# Patient Record
Sex: Male | Born: 1960 | Race: White | Hispanic: No | Marital: Married | Smoking: Former smoker
Health system: Southern US, Community
[De-identification: ages and names within clinical notes are randomized; demographics above are authoritative.]

## PROBLEM LIST (undated history)

## (undated) DIAGNOSIS — G4730 Sleep apnea, unspecified: Secondary | ICD-10-CM

## (undated) DIAGNOSIS — K649 Unspecified hemorrhoids: Secondary | ICD-10-CM

## (undated) DIAGNOSIS — T7840XA Allergy, unspecified, initial encounter: Secondary | ICD-10-CM

## (undated) DIAGNOSIS — I10 Essential (primary) hypertension: Secondary | ICD-10-CM

## (undated) DIAGNOSIS — M1990 Unspecified osteoarthritis, unspecified site: Secondary | ICD-10-CM

## (undated) HISTORY — PX: UVULOPALATOPHARYNGOPLASTY: SHX827

## (undated) HISTORY — PX: TONSILLECTOMY AND ADENOIDECTOMY: SUR1326

## (undated) HISTORY — DX: Unspecified osteoarthritis, unspecified site: M19.90

## (undated) HISTORY — DX: Essential (primary) hypertension: I10

## (undated) HISTORY — PX: OTHER SURGICAL HISTORY: SHX169

## (undated) HISTORY — DX: Unspecified hemorrhoids: K64.9

## (undated) HISTORY — DX: Sleep apnea, unspecified: G47.30

## (undated) HISTORY — DX: Allergy, unspecified, initial encounter: T78.40XA

---

## 1984-12-08 HISTORY — PX: OTHER SURGICAL HISTORY: SHX169

## 1999-01-27 ENCOUNTER — Encounter: Payer: Self-pay | Admitting: Emergency Medicine

## 1999-01-27 ENCOUNTER — Emergency Department (HOSPITAL_COMMUNITY): Admission: EM | Admit: 1999-01-27 | Discharge: 1999-01-27 | Payer: Self-pay | Admitting: Emergency Medicine

## 2002-03-14 ENCOUNTER — Encounter: Payer: Self-pay | Admitting: Family Medicine

## 2002-03-14 ENCOUNTER — Ambulatory Visit (HOSPITAL_COMMUNITY): Admission: RE | Admit: 2002-03-14 | Discharge: 2002-03-14 | Payer: Self-pay | Admitting: Family Medicine

## 2002-03-16 ENCOUNTER — Ambulatory Visit: Admission: RE | Admit: 2002-03-16 | Discharge: 2002-03-16 | Payer: Self-pay | Admitting: Family Medicine

## 2002-08-19 ENCOUNTER — Encounter: Payer: Self-pay | Admitting: Occupational Medicine

## 2002-08-19 ENCOUNTER — Encounter: Admission: RE | Admit: 2002-08-19 | Discharge: 2002-08-19 | Payer: Self-pay | Admitting: Occupational Medicine

## 2002-09-12 ENCOUNTER — Ambulatory Visit: Admission: RE | Admit: 2002-09-12 | Discharge: 2002-09-12 | Payer: Self-pay | Admitting: Critical Care Medicine

## 2002-12-10 ENCOUNTER — Emergency Department (HOSPITAL_COMMUNITY): Admission: EM | Admit: 2002-12-10 | Discharge: 2002-12-10 | Payer: Self-pay | Admitting: Emergency Medicine

## 2003-10-02 ENCOUNTER — Encounter: Payer: Self-pay | Admitting: Family Medicine

## 2003-10-02 ENCOUNTER — Encounter: Admission: RE | Admit: 2003-10-02 | Discharge: 2003-10-02 | Payer: Self-pay | Admitting: Family Medicine

## 2003-11-28 ENCOUNTER — Encounter: Admission: RE | Admit: 2003-11-28 | Discharge: 2004-01-24 | Payer: Self-pay | Admitting: Neurology

## 2003-12-11 ENCOUNTER — Ambulatory Visit (HOSPITAL_COMMUNITY): Admission: RE | Admit: 2003-12-11 | Discharge: 2003-12-11 | Payer: Self-pay | Admitting: Neurology

## 2004-12-19 ENCOUNTER — Emergency Department (HOSPITAL_COMMUNITY): Admission: EM | Admit: 2004-12-19 | Discharge: 2004-12-19 | Payer: Self-pay | Admitting: *Deleted

## 2004-12-20 ENCOUNTER — Ambulatory Visit: Payer: Self-pay | Admitting: Family Medicine

## 2005-01-03 ENCOUNTER — Ambulatory Visit: Payer: Self-pay | Admitting: Family Medicine

## 2005-06-11 ENCOUNTER — Emergency Department (HOSPITAL_COMMUNITY): Admission: EM | Admit: 2005-06-11 | Discharge: 2005-06-11 | Payer: Self-pay | Admitting: Emergency Medicine

## 2006-06-23 ENCOUNTER — Ambulatory Visit (HOSPITAL_COMMUNITY): Admission: RE | Admit: 2006-06-23 | Discharge: 2006-06-24 | Payer: Self-pay | Admitting: Neurosurgery

## 2006-10-27 ENCOUNTER — Ambulatory Visit: Payer: Self-pay | Admitting: Family Medicine

## 2007-03-12 ENCOUNTER — Ambulatory Visit: Payer: Self-pay | Admitting: Internal Medicine

## 2007-12-01 ENCOUNTER — Ambulatory Visit: Payer: Self-pay | Admitting: Internal Medicine

## 2009-03-05 ENCOUNTER — Ambulatory Visit: Payer: Self-pay | Admitting: Family Medicine

## 2009-03-05 DIAGNOSIS — I10 Essential (primary) hypertension: Secondary | ICD-10-CM | POA: Insufficient documentation

## 2009-03-14 ENCOUNTER — Ambulatory Visit: Payer: Self-pay | Admitting: Family Medicine

## 2010-02-28 ENCOUNTER — Ambulatory Visit: Payer: Self-pay | Admitting: Family Medicine

## 2010-02-28 LAB — CONVERTED CEMR LAB
Bilirubin Urine: NEGATIVE
Glucose, Urine, Semiquant: NEGATIVE
Specific Gravity, Urine: 1.015
WBC Urine, dipstick: NEGATIVE
pH: 6

## 2010-03-01 LAB — CONVERTED CEMR LAB
ALT: 19 units/L (ref 0–53)
BUN: 12 mg/dL (ref 6–23)
CO2: 32 meq/L (ref 19–32)
Chloride: 102 meq/L (ref 96–112)
Cholesterol: 147 mg/dL (ref 0–200)
Direct LDL: 89.7 mg/dL
Eosinophils Absolute: 0.2 10*3/uL (ref 0.0–0.7)
Eosinophils Relative: 2.5 % (ref 0.0–5.0)
Glucose, Bld: 102 mg/dL — ABNORMAL HIGH (ref 70–99)
HCT: 41.6 % (ref 39.0–52.0)
Lymphs Abs: 1.6 10*3/uL (ref 0.7–4.0)
MCHC: 32.9 g/dL (ref 30.0–36.0)
MCV: 85.7 fL (ref 78.0–100.0)
Monocytes Absolute: 0.5 10*3/uL (ref 0.1–1.0)
Platelets: 207 10*3/uL (ref 150.0–400.0)
Potassium: 3.9 meq/L (ref 3.5–5.1)
TSH: 1.09 microintl units/mL (ref 0.35–5.50)
Total Bilirubin: 0.4 mg/dL (ref 0.3–1.2)
Total Protein: 7.4 g/dL (ref 6.0–8.3)
VLDL: 41.6 mg/dL — ABNORMAL HIGH (ref 0.0–40.0)
WBC: 7.6 10*3/uL (ref 4.5–10.5)

## 2010-03-18 ENCOUNTER — Ambulatory Visit: Payer: Self-pay | Admitting: Family Medicine

## 2010-03-18 DIAGNOSIS — J309 Allergic rhinitis, unspecified: Secondary | ICD-10-CM | POA: Insufficient documentation

## 2010-03-18 DIAGNOSIS — R609 Edema, unspecified: Secondary | ICD-10-CM | POA: Insufficient documentation

## 2011-01-05 LAB — CONVERTED CEMR LAB
ALT: 26 units/L (ref 0–53)
Albumin: 4.2 g/dL (ref 3.5–5.2)
BUN: 12 mg/dL (ref 6–23)
Basophils Relative: 0.3 % (ref 0.0–3.0)
CO2: 31 meq/L (ref 19–32)
Chloride: 106 meq/L (ref 96–112)
Cholesterol: 153 mg/dL (ref 0–200)
Creatinine, Ser: 0.9 mg/dL (ref 0.4–1.5)
Eosinophils Absolute: 0.2 10*3/uL (ref 0.0–0.7)
Eosinophils Relative: 2.5 % (ref 0.0–5.0)
HCT: 43.5 % (ref 39.0–52.0)
LDL Cholesterol: 88 mg/dL (ref 0–99)
Lymphs Abs: 1.9 10*3/uL (ref 0.7–4.0)
MCHC: 35.4 g/dL (ref 30.0–36.0)
MCV: 82.9 fL (ref 78.0–100.0)
Monocytes Absolute: 0.4 10*3/uL (ref 0.1–1.0)
Neutrophils Relative %: 66.6 % (ref 43.0–77.0)
Platelets: 200 10*3/uL (ref 150.0–400.0)
Potassium: 4.1 meq/L (ref 3.5–5.1)
RBC: 5.25 M/uL (ref 4.22–5.81)
TSH: 1.65 microintl units/mL (ref 0.35–5.50)
Total Protein: 7.4 g/dL (ref 6.0–8.3)
Triglycerides: 181 mg/dL — ABNORMAL HIGH (ref 0.0–149.0)
WBC: 7.6 10*3/uL (ref 4.5–10.5)

## 2011-01-07 NOTE — Assessment & Plan Note (Signed)
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  It is always been in excellent health.  He said no chronic health problems except for hypertension.  He takes Zestoretic 20 -- 12.5 daily.  BP 120/74.  The new problem is allergic rhinitis for the past month.  No history of asthma.  He does take an aspirin tablet daily.  Another problem is ED.  Wants to try the Viagra.  History Jeni care, dental care, tetanus booster 2003, seasonal flu shot 2010  Allergies: No Known Drug Allergies  Past History:  Past medical, surgical, family and social histories (including risk factors) reviewed, and no changes noted (except as noted below).  Family History: Reviewed history and no changes required.  Social History: Reviewed history from 03/05/2009 and no changes required. Occupation:  organic farmer Married Never Smoked Alcohol use-no Drug use-no Regular exercise-yes  Review of Systems      See HPI  Physical Exam  General:  Well-developed,well-nourished,in no acute distress; alert,appropriate and cooperative throughout examination Head:  Normocephalic and atraumatic without obvious abnormalities. No apparent alopecia or balding. Eyes:  No corneal or conjunctival inflammation noted. EOMI. Perrla. Funduscopic exam benign, without hemorrhages, exudates or papilledema. Vision grossly normal. Ears:  External ear exam shows no significant lesions or deformities.  Otoscopic examination reveals clear canals, tympanic membranes are intact bilaterally without bulging, retraction, inflammation or discharge. Hearing is grossly normal  bilaterally. Nose:  External nasal examination shows no deformity or inflammation. Nasal mucosa are pink and moist without lesions or exudates. Mouth:  Oral mucosa and oropharynx without lesions or exudates.  Teeth in good repair. Neck:  No deformities, masses, or tenderness noted. Chest Wall:  No deformities, masses, tenderness or gynecomastia noted. Breasts:  No masses or gynecomastia noted Lungs:  Normal respiratory effort, chest expands symmetrically. Lungs are clear to auscultation, no crackles or wheezes. Heart:  Normal rate and regular rhythm. S1 and S2 normal without gallop, murmur, click, rub or other extra sounds. Abdomen:  Bowel sounds positive,abdomen soft and non-tender without masses, organomegaly or hernias noted. Rectal:  No external abnormalities noted. Normal sphincter tone. No rectal masses or tenderness. Genitalia:  Testes bilaterally descended without nodularity, tenderness or masses. No scrotal masses or lesions. No penis lesions or urethral discharge. Prostate:  Prostate gland firm and smooth, no enlargement, nodularity, tenderness, mass, asymmetry or induration. Msk:  No deformity or scoliosis noted of thoracic or lumbar spine.   Pulses:  R and L carotid,radial,femoral,dorsalis pedis and posterior tibial pulses are full and equal bilaterally Extremities:  1+ left pedal edema and 1+ right pedal edema.   Neurologic:  No cranial nerve deficits noted. Station and gait are normal. Plantar reflexes are down-going bilaterally. DTRs are symmetrical throughout. Sensory, motor and coordinative functions appear intact. Skin:  Intact without suspicious lesions or rashes Cervical Nodes:  No lymphadenopathy noted Axillary Nodes:  No palpable lymphadenopathy Inguinal Nodes:  No significant adenopathy Psych:  Cognition and judgment appear intact. Alert and cooperative with normal attention span and concentration.  No apparent delusions, illusions, hallucinations   Problems:  Medical  Problems Added: 1)  Dx of Physical Examination  (ICD-V70.0) 2)  Dx of Rhinitis  (ICD-477.9) 3)  Dx of Edema- Localized  (ICD-782.3)  Impression & Recommendations:  Problem # 1:  RHINITIS (ICD-477.9) Assessment New  Orders: Prescription Created Electronically [phone])  Problem # 2:  EDEMA- LOCALIZED (ICD-782.3) Assessment: New  The following medications were removed from the medication list:    Zestoretic 20-12.5 Mg Tabs (Lisinopril-hydrochlorothiazide) .Marland Kitchen... Take 1 tablet by mouth every morning His updated medication list for this problem includes:    Furosemide 20 Mg Tabs (Furosemide) .Marland Kitchen... Take 1 tablet by mouth every morning    Zestoretic 20-12.5 Mg Tabs (Lisinopril-hydrochlorothiazide) .Marland Kitchen... Take 1 tablet by mouth every morning  Orders: Prescription Created Electronically [id number])  Problem # 3:  HYPERTENSION NEC (ICD-997.91) Assessment: Improved  Orders: Prescription Created Electronically [phone]) EKG w/ Interpretation ([id number])  Problem # 4:  Preventive Health Care (ICD-V70.0) Assessment: Unchanged  Complete Medication List: 1)  Hydrocodone-acetaminophen 10-325 Mg Tabs (Hydrocodone-acetaminophen) .... Take 2 tabs three times a day 2)  Zantac 75 75 Mg Tabs (Ranitidine hcl) .... Once daily 3)  Aspir-low 81 Mg Tbec (Aspirin) .... Once daily 4)  Furosemide 20 Mg Tabs (Furosemide) .... Take 1 tablet by mouth every morning 5)  Zestoretic 20-12.5 Mg Tabs (Lisinopril-hydrochlorothiazide) .... Take 1 tablet by mouth every morning 6)  Viagra 50 Mg Tabs (Sildenafil citrate) .... Uad  Patient Instructions: 1)  take 20 mg of Lasix in the morning as needed for fluid retention. 2)  Continue other medications. 3)  The way to dose  Viagra is a one half tablet one hour prior to sex with water 4)  Please schedule a follow-up appointment in 1 year. 5)  Take an Aspirin every day. 6)  plain Claritin in the morning or plain Zyrtec at bedtime for the allergic rhinitis and  cough Prescriptions: VIAGRA 50 MG TABS (SILDENAFIL CITRATE) UAD  #6 x 11   Entered and Authorized by:   Roderick Pee MD   Signed by:   Roderick Pee MD on 03/18/2010   Method used:   Electronically to        [facility] [phone]* (retail)       [address]       [place], [place]  [address]       Ph: [phone] or [phone]       Fax: [phone]   RxID:   [id number] ZESTORETIC 20-12.5 MG TABS (LISINOPRIL-HYDROCHLOROTHIAZIDE) Take 1 tablet by mouth every morning  #100 x 3   Entered and Authorized by:   Roderick Pee MD   Signed by:   Roderick Pee MD on 03/18/2010   Method used:   Electronically to        [facility] [phone]* (retail)       [address]       [place], [place]  [address]       Ph: [phone] or [phone]       Fax: [phone]   RxID:   [id number] FUROSEMIDE 20 MG TABS (FUROSEMIDE) Take 1 tablet by mouth every morning  #100 x 3   Entered and Authorized by:   Roderick Pee MD   Signed by:   Roderick Pee MD on 03/18/2010   Method used:   Electronically to  [address] [phone]* (retail)       [address]       [place], [place]  [address]       Ph: [phone] or [phone]       Fax: [phone]   RxID:   [id number]    Immunization History:  Tetanus/Td Immunization History:    Tetanus/Td:  historical (12/08/2001)
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
## 2011-04-25 NOTE — Op Note (Signed)
NAME:  KHYRIN, TREVATHAN NO.:  [id number]   MEDICAL RECORD NO.:  [id number]          PATIENT TYPE:  OIB   LOCATION:  3018                         FACILITY:  [facility]   PHYSICIAN:  Payton Doughty, M.D.      DATE OF BIRTH:  12-May-1961   DATE OF PROCEDURE:  06/23/2006  DATE OF DISCHARGE:                                 OPERATIVE REPORT   PREOPERATIVE DIAGNOSIS:  Spondylosis and disk at C5-6, C6-7.   POSTOPERATIVE DIAGNOSIS:  Spondylosis and disk at C5-6, C6-7.   PROCEDURE:  C5-6, C6-7 anterior cervical decompression and fusion with  reflex hybrid plate.   SURGEON:  Payton Doughty, M.D.   ANESTHESIA:  General endotracheal.   PREPARATION:  Prepped with sterile prep with alcohol wipe.   COMPLICATIONS:  None.   ASSISTANT:  Nurse assistant was [place].  Doctor assistant was Dr.  Lovell Sheehan.   PROCEDURE IN DETAIL:  This patient is a 50 year old gentleman with cervical  spondylosis and left C6 radiculopathy.  He was taken to the operating room  where he was intubated.  Placed supine on the operating room table in the  Halter head traction with the neck extended.  Following shaving, he was  prepped and draped in the usual sterile fashion.  The skin was incised in  the midline and the medial border of the sternocleidomastoid muscle on the  left side.  The platysmas was identified, elevated and divided and  undermined.  Sternocleidomastoid was identified and medial dissection  revealed the carotid artery, retracted laterally to the left.  Trachea and  esophagus were retracted laterally to the right exposing the bony cervical  spine.  Markers were placed.  Intraoperative x-ray obtained and confirmed  correctness of level.  Having confirmed correctness of level, blunt scar was  taken down bilaterally and a self-retaining __________ retractor placed  transversely in a cephalocaudal direction.  Diskectomy was carried out at C5-  6 and C6-7 under gross observation.  The operating  microscope was then  brought in.  We used microdissection technique to remove the remaining disk,  explore the neuroforamen, divide the posterior annulus and the posterior  longitudinal ligament.  At 5-6, there was disk out to the left side and the  neuroforamen was removed without difficulty.  At 6-7, there was a central  inferiorly extruded disk in the midline.  Following complete diskectomies  and exploration of all four neuroforamen, 8-mm bone grafts were fashioned  from patella allograft and tapped into place.  A 37-mm reflex hybrid plate  was placed with 12-mm screws, two in C5, two in C6 and two in C7.  Intraoperative x-rays showed good placement of bone grafts, plate and  screws.  The wound was irrigated and hemostasis assured.  The platysmas and  subcutaneous tissues were re-approximated with 3-0 Vicryl in an interrupted  fashion.  Skin was closed with 4-0 Vicryl in a running subcuticular fashion.  Benzoin and Steri-strips were placed and made occlusive with Telfa and  OpSite.  The patient was placed in an aspen collar and returned to the  recovery room in good condition.  ______________________________  Payton Doughty, M.D.     MWR/MEDQ  D:  06/23/2006  T:  06/24/2006  Job:  [id number]

## 2011-05-01 ENCOUNTER — Other Ambulatory Visit: Payer: Self-pay

## 2011-05-02 ENCOUNTER — Other Ambulatory Visit (INDEPENDENT_AMBULATORY_CARE_PROVIDER_SITE_OTHER): Payer: BC Managed Care – PPO

## 2011-05-02 DIAGNOSIS — Z0000 Encounter for general adult medical examination without abnormal findings: Secondary | ICD-10-CM

## 2011-05-02 LAB — LIPID PANEL
HDL: 33 mg/dL — ABNORMAL LOW (ref >39.00)
Total CHOL/HDL Ratio: 5

## 2011-05-02 LAB — POCT URINALYSIS DIPSTICK
Blood, UA: NEGATIVE
Glucose, UA: NEGATIVE
Nitrite, UA: NEGATIVE
Urobilinogen, UA: 0.2

## 2011-05-02 LAB — BASIC METABOLIC PANEL
Calcium: 8.9 mg/dL (ref 8.4–10.5)
Chloride: 101 mEq/L (ref 96–112)
Creatinine, Ser: 1 mg/dL (ref 0.4–1.5)
Sodium: 137 mEq/L (ref 135–145)

## 2011-05-02 LAB — CBC WITH DIFFERENTIAL/PLATELET
Basophils Relative: 0.4 % (ref 0.0–3.0)
Eosinophils Relative: 1.8 % (ref 0.0–5.0)
Lymphocytes Relative: 18.1 % (ref 12.0–46.0)
Monocytes Relative: 7.8 % (ref 3.0–12.0)
Neutrophils Relative %: 71.9 % (ref 43.0–77.0)
RBC: 4.9 Mil/uL (ref 4.22–5.81)
WBC: 10 10*3/uL (ref 4.5–10.5)

## 2011-05-02 LAB — HEPATIC FUNCTION PANEL
ALT: 17 U/L (ref 0–53)
AST: 18 U/L (ref 0–37)
Alkaline Phosphatase: 53 U/L (ref 39–117)
Bilirubin, Direct: 0.1 mg/dL (ref 0.0–0.3)
Total Protein: 7.1 g/dL (ref 6.0–8.3)

## 2011-05-02 LAB — TSH: TSH: 2.06 u[IU]/mL (ref 0.35–5.50)

## 2011-05-08 ENCOUNTER — Ambulatory Visit (INDEPENDENT_AMBULATORY_CARE_PROVIDER_SITE_OTHER): Payer: BC Managed Care – PPO | Admitting: Family Medicine

## 2011-05-08 ENCOUNTER — Encounter: Payer: Self-pay | Admitting: Family Medicine

## 2011-05-08 DIAGNOSIS — N529 Male erectile dysfunction, unspecified: Secondary | ICD-10-CM | POA: Insufficient documentation

## 2011-05-08 DIAGNOSIS — I10 Essential (primary) hypertension: Secondary | ICD-10-CM

## 2011-05-08 DIAGNOSIS — R5381 Other malaise: Secondary | ICD-10-CM

## 2011-05-08 DIAGNOSIS — R5383 Other fatigue: Secondary | ICD-10-CM | POA: Insufficient documentation

## 2011-05-08 MED ORDER — LISINOPRIL-HYDROCHLOROTHIAZIDE 20-25 MG PO TABS: 1.0000 | ORAL_TABLET | Freq: Every day | ORAL | Status: DC

## 2011-05-08 MED ORDER — SILDENAFIL CITRATE 100 MG PO TABS: 100.0000 mg | ORAL_TABLET | ORAL | Status: DC | PRN

## 2011-05-08 MED ORDER — FUROSEMIDE 20 MG PO TABS: 20.0000 mg | ORAL_TABLET | Freq: Every day | ORAL | Status: DC

## 2011-05-08 NOTE — Patient Instructions (Addendum)
Continue your current medications.  I will call you when I get the report on your lab work back.  Increase the Viagra to 100 mg   take the Lasix one Monday, Wednesday, Friday

## 2011-05-08 NOTE — Progress Notes (Signed)
  Subjective:    Patient ID: Kenneth Berry, male    DOB: July 21, 1961, 50 y.o.   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  No new problems of fatigue.  Four hypertension.  He takes Zestoretic 20 to 25 daily.  BP 110/78.  He takes Lasix 20 mg q.a.m. Because of a history of venous insufficiency and peripheral edema.  He takes OTC Zantac 25 mg daily for reflux esophagitis.  He uses Viagra, but now has used 50 mg a day.  Still is not having good function.  He also has fatigue, and no libido.  We will need to check a testosterone level.  He also takes an 81-mg baby aspirin.  He gets routine eye care, dental care, tetanus, 2003.    Review of Systems  Constitutional: Positive for fatigue.  HENT: Negative.   Eyes: Negative.   Respiratory: Negative.   Cardiovascular: Negative.   Gastrointestinal: Negative.   Genitourinary: Negative.   Musculoskeletal: Negative.   Skin: Negative.   Neurological: Negative.   Hematological: Negative.   Psychiatric/Behavioral: Negative.        Objective:   Physical Exam  Constitutional: He is oriented to person, place, and time. He appears well-developed and well-nourished.  HENT:  Head: Normocephalic and atraumatic.  Right Ear: External ear normal.  Left Ear: External ear normal.  Nose: Nose normal.  Mouth/Throat: Oropharynx is clear and moist.  Eyes: Conjunctivae and EOM are normal. Pupils are equal, round, and reactive to light.  Neck: Normal range of motion. Neck supple. No JVD present. No tracheal deviation present. No thyromegaly present.  Cardiovascular: Normal rate, regular rhythm, normal heart sounds and intact distal pulses.  Exam reveals no gallop and no friction rub.   No murmur heard. Pulmonary/Chest: Effort normal and breath  sounds normal. No stridor. No respiratory distress. He has no wheezes. He has no rales. He exhibits no tenderness.  Abdominal: Soft. Bowel sounds are normal. He exhibits no distension and no mass. There is no tenderness. There is no rebound and no guarding.  Genitourinary: Rectum normal, prostate normal and penis normal. Guaiac negative stool. No penile tenderness.  Musculoskeletal: Normal range of motion. He exhibits no edema and no tenderness.  Lymphadenopathy:    He has no cervical adenopathy.  Neurological: He is alert and oriented to person, place, and time. He has normal reflexes. No cranial nerve deficit. He exhibits normal muscle tone.  Skin: Skin is warm and dry. No rash noted. No erythema. No pallor.  Psychiatric: He has a normal mood and affect. His behavior is normal. Judgment and thought content normal.          Assessment & Plan:  Hypertension continue the Zestoretic one daily.  Peripheral edema continue Lasix 20 mg daily  Erectile dysfunction, increase the Viagra to 100 mg p.r.n.  Fatigue.  Check testosterone level

## 2011-05-09 LAB — TESTOSTERONE: Testosterone: 144.88 ng/dL — ABNORMAL LOW (ref 350.00–890.00)

## 2011-05-15 ENCOUNTER — Telehealth: Payer: Self-pay | Admitting: *Deleted

## 2011-05-15 MED ORDER — TESTOSTERONE 12.5 MG/ACT (1%) TD GEL: 2.0000 | TRANSDERMAL | Status: DC

## 2011-05-15 NOTE — Telephone Encounter (Signed)
Message copied by Trenton Gammon on Thu May 15, 2011  3:14 PM ------      Message from: TODD, JEFFREY A      Created: Sat May 10, 2011 10:39 AM       Testosterone level is 144...............Marland Kitchen Low normal was 350........... Therefore he does have a testosterone deficiency........ Please call N. Androgel.......Marland Kitchen Directions two puffs daily,,,,,,,,, follow-up TSH level in 4 weeks with office visit

## 2011-05-15 NOTE — Telephone Encounter (Signed)
Pt is aware, rx called in, appointment made.

## 2011-05-16 ENCOUNTER — Other Ambulatory Visit: Payer: Self-pay | Admitting: Family Medicine

## 2011-05-16 NOTE — Telephone Encounter (Signed)
Patient would like to know if there is anything other than androgel to try because it will cost $180?

## 2011-06-10 ENCOUNTER — Other Ambulatory Visit (INDEPENDENT_AMBULATORY_CARE_PROVIDER_SITE_OTHER): Payer: BC Managed Care – PPO

## 2011-06-10 DIAGNOSIS — N529 Male erectile dysfunction, unspecified: Secondary | ICD-10-CM

## 2011-06-12 NOTE — Progress Notes (Signed)
Left message on machine for patient

## 2012-05-18 ENCOUNTER — Other Ambulatory Visit: Payer: Self-pay | Admitting: Family Medicine

## 2012-07-22 ENCOUNTER — Other Ambulatory Visit: Payer: Self-pay | Admitting: Family Medicine

## 2012-09-18 ENCOUNTER — Other Ambulatory Visit: Payer: Self-pay | Admitting: Family Medicine

## 2013-04-07 ENCOUNTER — Ambulatory Visit (INDEPENDENT_AMBULATORY_CARE_PROVIDER_SITE_OTHER): Payer: BC Managed Care – PPO | Admitting: Family Medicine

## 2013-04-07 ENCOUNTER — Encounter: Payer: Self-pay | Admitting: Family Medicine

## 2013-04-07 VITALS — BP 140/90 | Temp 98.8°F | Wt 286.0 lb

## 2013-04-07 DIAGNOSIS — R609 Edema, unspecified: Secondary | ICD-10-CM

## 2013-04-07 DIAGNOSIS — I10 Essential (primary) hypertension: Secondary | ICD-10-CM

## 2013-04-07 DIAGNOSIS — IMO0002 Reserved for concepts with insufficient information to code with codable children: Secondary | ICD-10-CM

## 2013-04-07 DIAGNOSIS — N529 Male erectile dysfunction, unspecified: Secondary | ICD-10-CM

## 2013-04-07 LAB — BASIC METABOLIC PANEL
CO2: 27 mEq/L (ref 19–32)
Chloride: 105 mEq/L (ref 96–112)
Potassium: 4.2 mEq/L (ref 3.5–5.1)

## 2013-04-07 LAB — CBC WITH DIFFERENTIAL/PLATELET
Basophils Relative: 0.4 % (ref 0.0–3.0)
Eosinophils Relative: 2.1 % (ref 0.0–5.0)
HCT: 44 % (ref 39.0–52.0)
Hemoglobin: 14.9 g/dL (ref 13.0–17.0)
Lymphs Abs: 1.9 10*3/uL (ref 0.7–4.0)
MCV: 81.4 fl (ref 78.0–100.0)
Monocytes Absolute: 0.6 10*3/uL (ref 0.1–1.0)
Neutro Abs: 7.2 10*3/uL (ref 1.4–7.7)
Platelets: 216 10*3/uL (ref 150.0–400.0)
RBC: 5.4 Mil/uL (ref 4.22–5.81)
WBC: 10 10*3/uL (ref 4.5–10.5)

## 2013-04-07 LAB — POCT URINALYSIS DIPSTICK
Blood, UA: NEGATIVE
Ketones, UA: NEGATIVE
Protein, UA: NEGATIVE
Spec Grav, UA: 1.02
pH, UA: 6

## 2013-04-07 LAB — HEPATIC FUNCTION PANEL
ALT: 18 U/L (ref 0–53)
Bilirubin, Direct: 0.1 mg/dL (ref 0.0–0.3)
Total Bilirubin: 0.6 mg/dL (ref 0.3–1.2)

## 2013-04-07 LAB — LDL CHOLESTEROL, DIRECT: Direct LDL: 105.5 mg/dL

## 2013-04-07 LAB — LIPID PANEL: HDL: 26.9 mg/dL — ABNORMAL LOW (ref >39.00)

## 2013-04-07 MED ORDER — FUROSEMIDE 20 MG PO TABS: ORAL_TABLET | ORAL | Status: DC

## 2013-04-07 MED ORDER — SILDENAFIL CITRATE 100 MG PO TABS: 100.0000 mg | ORAL_TABLET | ORAL | Status: DC | PRN

## 2013-04-07 MED ORDER — LISINOPRIL-HYDROCHLOROTHIAZIDE 20-25 MG PO TABS: 1.0000 | ORAL_TABLET | Freq: Every day | ORAL | Status: DC

## 2013-04-07 NOTE — Progress Notes (Signed)
  Subjective:    Patient ID: GIULIO BERTINO, male    DOB: [birth year], 52 y.o.   MRN: [id number]  HPI Tilman is a 52 year old married male nonsmoker who comes in today after a years absence to discuss hypertension reflux esophagitis erectile dysfunction overweight and low testosterone  He is not taking any of his medications for the last couple months. Weight is 286 BP 140/90.  He also tells me he had cervical disc surgery by Dr. Joylene Igo. He saw a neurologist in [facility] for treatment of pain. He was on Vicodin 2 tabs 3-4 times daily however when he stopped the Vicodin he didn't feel any increase in his pain. He currently takes over-the-counter Tylenol and/or Motrin. He was told by Dr. Channing Mutters had some bulging in the thoracic and lumbar area.   Review of Systems Review of systems negative    Objective:   Physical Exam Well-developed well-nourished male in no acute distress weight 286 pounds BP 140/90       Assessment & Plan:  Overweight............Marland Kitchen Diet exercise and weight loss...  , Hypertension restart medication,  Peripheral edema Lasix 20 mg daily when necessary  Erectile dysfunction Viagra when necessary  Low testosterone recommend diet exercise and weight loss to increased testosterone level  Labs today  Followup CPX in July

## 2013-04-07 NOTE — Patient Instructions (Addendum)
Lasix 20 mg,,,,,,,,, 1 daily in the morning as needed for fluid retention,  Zestoretic 20-25,,,,,,,,,,,, 1 daily in the morning for high blood pressure  Over-the-counter Zantac daily when necessary  Viagra 100 mg,,,,,,,,,,,, 1/2-1 when necessary  Diet exercise and weight loss.............. This will help increase your overall energy and increase your testosterone level naturally  Motrin 600 mg twice daily when necessary or Tylenol 2 tabs 3 times daily

## 2013-06-01 ENCOUNTER — Other Ambulatory Visit: Payer: BC Managed Care – PPO

## 2013-06-08 ENCOUNTER — Encounter: Payer: BC Managed Care – PPO | Admitting: Family Medicine

## 2013-06-16 ENCOUNTER — Ambulatory Visit (INDEPENDENT_AMBULATORY_CARE_PROVIDER_SITE_OTHER)
Admission: RE | Admit: 2013-06-16 | Discharge: 2013-06-16 | Disposition: A | Discharge status: Home / Self Care | Payer: BC Managed Care – PPO | Source: Ambulatory Visit | Attending: Family Medicine | Admitting: Family Medicine

## 2013-06-16 ENCOUNTER — Ambulatory Visit (INDEPENDENT_AMBULATORY_CARE_PROVIDER_SITE_OTHER): Payer: BC Managed Care – PPO | Admitting: Family Medicine

## 2013-06-16 ENCOUNTER — Encounter: Payer: Self-pay | Admitting: Family Medicine

## 2013-06-16 VITALS — BP 110/70 | HR 110 | Temp 98.9°F | Ht 70.5 in | Wt 279.0 lb

## 2013-06-16 DIAGNOSIS — K625 Hemorrhage of anus and rectum: Secondary | ICD-10-CM

## 2013-06-16 DIAGNOSIS — R5381 Other malaise: Secondary | ICD-10-CM

## 2013-06-16 DIAGNOSIS — R609 Edema, unspecified: Secondary | ICD-10-CM

## 2013-06-16 DIAGNOSIS — I499 Cardiac arrhythmia, unspecified: Secondary | ICD-10-CM

## 2013-06-16 DIAGNOSIS — IMO0002 Reserved for concepts with insufficient information to code with codable children: Secondary | ICD-10-CM

## 2013-06-16 DIAGNOSIS — R0602 Shortness of breath: Secondary | ICD-10-CM

## 2013-06-16 DIAGNOSIS — Z0000 Encounter for general adult medical examination without abnormal findings: Secondary | ICD-10-CM

## 2013-06-16 DIAGNOSIS — N529 Male erectile dysfunction, unspecified: Secondary | ICD-10-CM

## 2013-06-16 DIAGNOSIS — I10 Essential (primary) hypertension: Secondary | ICD-10-CM

## 2013-06-16 DIAGNOSIS — G4730 Sleep apnea, unspecified: Secondary | ICD-10-CM

## 2013-06-16 DIAGNOSIS — R5383 Other fatigue: Secondary | ICD-10-CM

## 2013-06-16 DIAGNOSIS — J309 Allergic rhinitis, unspecified: Secondary | ICD-10-CM

## 2013-06-16 MED ORDER — FUROSEMIDE 20 MG PO TABS: ORAL_TABLET | ORAL | Status: DC

## 2013-06-16 MED ORDER — SILDENAFIL CITRATE 100 MG PO TABS: 100.0000 mg | ORAL_TABLET | ORAL | Status: DC | PRN

## 2013-06-16 MED ORDER — LISINOPRIL-HYDROCHLOROTHIAZIDE 10-12.5 MG PO TABS: 1.0000 | ORAL_TABLET | Freq: Every day | ORAL | Status: DC

## 2013-06-16 MED ORDER — HYDROCORTISONE ACETATE 25 MG RE SUPP: RECTAL | Status: DC

## 2013-06-16 NOTE — Progress Notes (Signed)
Subjective:    Patient ID: Kenneth Berry, male    DOB: 22-Nov-1961, 52 y.o.   MRN: [id number]  HPI Kenneth Berry is a 52 year old married male nonsmoker,,,,, did smoke from age 62 2 age 31 ,,,one pack a day,,,,,,,,who comes in today for general physical examination because of a history of hypertension, peripheral edema, reflux esophagitis, erectile dysfunction, and a new problem of shortness of breath  About 2 months ago he noted shortness of breath with exertion. Since that time he comes and goes. He doesn't have any chest pain. He sometimes wakes up at night short of breath. He says it it doesn't take much exercise because of shortness of breath. He has no history of any cardiac or pulmonary disease. Except for sleep apnea. He had surgery about 10 years ago which involved removing the uvula. He still has symptoms of sleep apnea the night. He's never had evaluation for CPAP.  His father is 34 and has congestive heart failure and atrial flutter mother is 62 alive and well 3 brothers one sister all 4 good health.  He also notes some lightheadedness with stands up suddenly. BP today 110/70  His cardiac and pulmonary exams were normal EKG was normal although I didn't palpate a couple skips that didn't show up on the cardiogram. He walked around the office for about 3 minutes with the pulse ox on his finger. Pulse ox remains stable at 96 heart rate varied between 112 and it dropped to 67 with exercise  Again he denies any chest pain   Review of Systems  Constitutional: Negative.   HENT: Negative.   Eyes: Negative.   Respiratory: Negative.   Cardiovascular: Negative.   Gastrointestinal: Negative.   Genitourinary: Negative.   Musculoskeletal: Negative.   Skin: Negative.   Neurological: Negative.   Psychiatric/Behavioral: Negative.   he's also had a history of bright red rectal bleeding once or twice weekly since age 40. He says at that juncture had a hemorrhoid lanced and since that time he said  intermittent bright red rectal bleeding. Has been no change in bowel habits. He treats it symptomatically with over-the-counter medication but the bleeding has persisted since age 76     Objective:   Physical Exam  Nursing note and vitals reviewed. Constitutional: He is oriented to person, place, and time. He appears well-developed and well-nourished.  HENT:  Head: Normocephalic and atraumatic.  Right Ear: External ear normal.  Left Ear: External ear normal.  Nose: Nose normal.  Mouth/Throat: Oropharynx is clear and moist.  Eyes: Conjunctivae and EOM are normal. Pupils are equal, round, and reactive to light.  Neck: Normal range of motion. Neck supple. No JVD present. No tracheal deviation present. No thyromegaly present.  Cardiovascular: Normal rate, regular rhythm, normal heart sounds and intact distal pulses.  Exam reveals no gallop and no friction rub.   No murmur heard. No cardiac murmur, no bruits in the carotids or the aorta, peripheral pulses 2+ and symmetrical  Pulmonary/Chest: Effort normal and breath sounds normal. No stridor. No respiratory distress. He has no wheezes. He has no rales. He exhibits no tenderness.  Abdominal: Soft. Bowel sounds are normal. He exhibits no distension and no mass. There is no tenderness. There is no rebound and no guarding.  Genitourinary: Rectum normal, prostate normal and penis normal. Guaiac negative stool. No penile tenderness.  Musculoskeletal: Normal range of motion. He exhibits no edema and no tenderness.  Lymphadenopathy:    He has no cervical adenopathy.  Neurological: He is  alert and oriented to person, place, and time. He has normal reflexes. No cranial nerve deficit. He exhibits normal muscle tone.  Skin: Skin is warm and dry. No rash noted. No erythema. No pallor.  Total body skin exam normal  Psychiatric: He has a normal mood and affect. His behavior is normal. Judgment and thought content normal.          Assessment & Plan:     Healthy male  Hypertension was symptoms of low blood pressure decrease lisinopril to 10 mg daily  Peripheral edema and 80 pounds of weight gain over the last 10 years Lasix 20 daily discussed diet exercise and weight loss  Reflux esophagitis continue Zantac 75 mg daily  Erectile dysfunction continue Viagra when necessary    Shortness of breath for 2 months without chest pain. Begin workup with chest x-ray labs and cardiac stress testing  History of sleep apnea status post removal of his uvula....... Still symptomatic sleep apnea which may be contributing to his shortness of breath  Obesity weight 279  Bright red rectal bleeding since age 9 outlined a program to get the bleeding to stop GI consult if bleeding persists

## 2013-06-16 NOTE — Patient Instructions (Addendum)
Continue your current medications except we will decrease the Zestoretic to 10 mg daily  We will set you up a consult in pulmonary to evaluate the shortness of breath and sleep apnea. If in the meantime you develop any chest pain with shortness of breath call 911 and come to the hospital via EMS  Begin a dietary program,,,,,,,, 1500 calories daily,,,,,,,,,,, weight herself daily  Drink lots of water  Milk of magnesia prune juice daily  Soak nightly and hot water  History medicated suppository your rectum every night for the next 12 nights  If after that the bleeding persists let us know and we will get you set up for a GI consult

## 2013-07-08 ENCOUNTER — Ambulatory Visit (INDEPENDENT_AMBULATORY_CARE_PROVIDER_SITE_OTHER): Payer: BC Managed Care – PPO | Admitting: Critical Care Medicine

## 2013-07-08 ENCOUNTER — Encounter: Payer: Self-pay | Admitting: Emergency Medicine

## 2013-07-08 ENCOUNTER — Encounter: Payer: BC Managed Care – PPO | Admitting: Emergency Medicine

## 2013-07-08 VITALS — BP 106/64

## 2013-07-08 VITALS — BP 106/64 | HR 104 | Temp 99.0°F | Ht 71.0 in | Wt 281.6 lb

## 2013-07-08 DIAGNOSIS — R0602 Shortness of breath: Secondary | ICD-10-CM

## 2013-07-08 DIAGNOSIS — R5381 Other malaise: Secondary | ICD-10-CM

## 2013-07-08 DIAGNOSIS — R5383 Other fatigue: Secondary | ICD-10-CM

## 2013-07-08 NOTE — Progress Notes (Deleted)
  Subjective:    Patient ID: Kenneth Berry, male    DOB: 04/27/1961, 52 y.o.   MRN: [id number]  HPI    Review of Systems  Constitutional: Negative for fever and unexpected weight change.  HENT: Positive for congestion and postnasal drip. Negative for ear pain, nosebleeds, sore throat, rhinorrhea, sneezing, trouble swallowing, dental problem and sinus pressure.   Eyes: Negative for redness and itching.  Respiratory: Positive for shortness of breath. Negative for cough, chest tightness and wheezing.   Cardiovascular: Positive for leg swelling. Negative for palpitations.  Gastrointestinal: Negative for nausea and vomiting.  Genitourinary: Negative for dysuria.  Musculoskeletal: Negative for joint swelling.  Skin: Negative for rash.  Neurological: Negative for headaches.  Hematological: Does not bruise/bleed easily.       Objective:   Physical Exam        Assessment & Plan:

## 2013-07-08 NOTE — Patient Instructions (Signed)
Sleep study will be obtained A pulmonary function study will be performed Echocardiogram will be obtained We will call results

## 2013-07-08 NOTE — Progress Notes (Signed)
  Subjective:    Patient ID: Kenneth Berry, male    DOB: 01-01-1961, 52 y.o.   MRN: [id number]  HPI Comments: ?osa and dyspnea.  Prior sleep study 53yrs ago. Pt rx T and A UPP,  Was on cpap, hard time , got off cpap. Gradually over time symptoms worse  Shortness of Breath This is a new problem. The current episode started more than 1 month ago (x 3months, insideous onset). The problem occurs constantly (feels constantly dyspneic). The problem has been gradually worsening. Associated symptoms include leg swelling and PND. Pertinent negatives include no abdominal pain, chest pain, claudication, coryza, ear pain, fever, headaches, hemoptysis, leg pain, neck pain, orthopnea, rash, rhinorrhea, sore throat, sputum production, swollen glands, syncope, vomiting or wheezing. The symptoms are aggravated by fumes, odors, weather changes, any activity and exercise. Associated symptoms comments: Cough is prod min mucus. Risk factors include smoking. He has tried nothing for the symptoms. There is no history of allergies, aspirin allergies, asthma, bronchiolitis, CAD, chronic lung disease, COPD, DVT, a heart failure, PE, pneumonia or a recent surgery.   Notes daytime sleepiness, feels tired.  No excess pm sleepiness Typical meals: Grits, eggs, apples,  Dinner fried apples, sausage, bacon, lunch  Meat , potatoes Pasta    Review of Systems  Constitutional: Negative for fever and unexpected weight change.  HENT: Positive for congestion and postnasal drip. Negative for ear pain, nosebleeds, sore throat, rhinorrhea, sneezing, trouble swallowing, neck pain, dental problem and sinus pressure.   Eyes: Negative for redness and itching.  Respiratory: Positive for shortness of breath. Negative for cough, hemoptysis, sputum production, chest tightness and wheezing.   Cardiovascular: Positive for leg swelling and PND. Negative for chest pain, palpitations, orthopnea, claudication and syncope.  Gastrointestinal:  Negative for nausea, vomiting and abdominal pain.  Genitourinary: Negative for dysuria.  Musculoskeletal: Negative for joint swelling.  Skin: Negative for rash.  Neurological: Negative for headaches.  Hematological: Does not bruise/bleed easily.  Psychiatric/Behavioral: Negative for dysphoric mood. The patient is not nervous/anxious.        Objective:   Physical Exam        Assessment & Plan:

## 2013-07-10 ENCOUNTER — Encounter: Payer: Self-pay | Admitting: Critical Care Medicine

## 2013-07-10 NOTE — Assessment & Plan Note (Signed)
Dyspnea on exertion with more classic signs/symptoms of obstructive sleep apnea Significant obesity with likely restrictive component.  Doubt obstructive feature Note normal chest xray. Exam not consistent with air flow obstruction Plan Obtain sleep study Obtain full PFTs Obtain echo for eval of LV function and assess for right sided pressure elevations

## 2013-07-10 NOTE — Progress Notes (Signed)
Subjective:    Patient ID: Kenneth Berry, male    DOB: March 04, 1961, 52 y.o.   MRN: [id number]  HPI 52 y.o. Hx of dyspnea on exertion progressively worse over several months with associated daytime sleepiness and nocturnal choking and snoring.  No assoc chest pain. Notes some leg edema. 10# weight gain over past 1 year.  Notes dietary indiscretion.    Here for poss sleep disordered breathing eval and dyspnea eval. No pFTs or sleep study yet done  Pt denies any significant sore throat, nasal congestion or excess secretions, fever, chills, sweats, unintended weight loss, pleurtic or exertional chest pain, orthopnea PND, or leg swelling Pt denies any increase in rescue therapy over baseline, denies waking up needing it or having any early am or nocturnal exacerbations of coughing/wheezing/or dyspnea. Pt also denies any obvious fluctuation in symptoms with  weather or environmental change or other alleviating or aggravating factors  See sleep questionnaire in epic  Past Medical History  Diagnosis Date  . Hypertension   . Sleep apnea      Family History  Problem Relation Age of Onset  . Cancer Maternal Grandfather     lung     History   Social History  . Marital Status: Married    Spouse Name: N/A    Number of Children: 2  . Years of Education: N/A   Occupational History  . [employer]    Social History Main Topics  . Smoking status: Former Smoker -- 0.75 packs/day for 10 years    Types: Cigarettes    Quit date: 12/08/1988  . Smokeless tobacco: Not on file  . Alcohol Use: No  . Drug Use: No  . Sexually Active: Not on file   Other Topics Concern  . Not on file   Social History Narrative  . No narrative on file     No Known Allergies   Outpatient Prescriptions Prior to Visit  Medication Sig Dispense Refill  . acetaminophen (TYLENOL) 325 MG tablet Take 650 mg by mouth every 6 (six) hours as needed for pain.      Marland Kitchen aspirin 81 MG tablet Take 81 mg by mouth daily.         . cetirizine (ZYRTEC) 10 MG tablet Take 10 mg by mouth daily.      . furosemide (LASIX) 20 MG tablet TAKE  ONE TABLET BY MOUTH EVERY MORNING  100 tablet  3  . lisinopril-hydrochlorothiazide (PRINZIDE,ZESTORETIC) 10-12.5 MG per tablet Take 1 tablet by mouth daily.  90 tablet  3  . sildenafil (VIAGRA) 100 MG tablet Take 1 tablet (100 mg total) by mouth as needed for erectile dysfunction.  10 tablet  6  . hydrocortisone (ANUSOL-HC) 25 MG suppository 1 suppository nightly  12 suppository  1   No facility-administered medications prior to visit.       Review of Systems  Respiratory: Positive for choking and shortness of breath.   Cardiovascular: Positive for leg swelling.       Objective:   Physical Exam Filed Vitals:   07/08/13 1456  BP: 106/64    Gen: Pleasant, well-nourished, in no distress,  normal affect  ENT: No lesions,  mouth clear,  oropharynx clear, no postnasal drip  Neck: No JVD, no TMG, no carotid bruits  Lungs: No use of accessory muscles, no dullness to percussion, clear without rales or rhonchi  Cardiovascular: RRR, heart sounds normal, no murmur or gallops, 1+  peripheral edema  Abdomen: soft and NT, no HSM,  BS  normal  Musculoskeletal: No deformities, no cyanosis or clubbing  Neuro: alert, non focal  Skin: Warm, no lesions or rashes  No results found.  Cxr: NAD       Assessment & Plan:   SOB (shortness of breath) on exertion Dyspnea on exertion with more classic signs/symptoms of obstructive sleep apnea Significant obesity with likely restrictive component.  Doubt obstructive feature Note normal chest xray. Exam not consistent with air flow obstruction Plan Obtain sleep study Obtain full PFTs Obtain echo for eval of LV function and assess for right sided pressure elevations  Fatigue Ongoing daytime sleepiness with associated snoring.  Mild elevation in epworth score Plan Sleep study ordered    Updated Medication List Outpatient  Encounter Prescriptions as of 07/08/2013  Medication Sig Dispense Refill  . acetaminophen (TYLENOL) 325 MG tablet Take 650 mg by mouth every 6 (six) hours as needed for pain.      Marland Kitchen aspirin 81 MG tablet Take 81 mg by mouth daily.        . cetirizine (ZYRTEC) 10 MG tablet Take 10 mg by mouth daily.      . furosemide (LASIX) 20 MG tablet TAKE  ONE TABLET BY MOUTH EVERY MORNING  100 tablet  3  . lisinopril-hydrochlorothiazide (PRINZIDE,ZESTORETIC) 10-12.5 MG per tablet Take 1 tablet by mouth daily.  90 tablet  3  . sildenafil (VIAGRA) 100 MG tablet Take 1 tablet (100 mg total) by mouth as needed for erectile dysfunction.  10 tablet  6  . hydrocortisone (ANUSOL-HC) 25 MG suppository 1 suppository nightly  12 suppository  1   No facility-administered encounter medications on file as of 07/08/2013.

## 2013-07-10 NOTE — Assessment & Plan Note (Signed)
Ongoing daytime sleepiness with associated snoring.  Mild elevation in epworth score Plan Sleep study ordered

## 2013-07-13 ENCOUNTER — Ambulatory Visit (INDEPENDENT_AMBULATORY_CARE_PROVIDER_SITE_OTHER): Payer: BC Managed Care – PPO | Admitting: Critical Care Medicine

## 2013-07-13 DIAGNOSIS — R0602 Shortness of breath: Secondary | ICD-10-CM

## 2013-07-13 NOTE — Progress Notes (Signed)
PFT done. Angelie Kram, CMA  

## 2013-07-14 ENCOUNTER — Ambulatory Visit (HOSPITAL_COMMUNITY): Payer: BC Managed Care – PPO | Attending: Cardiology | Admitting: Radiology

## 2013-07-14 DIAGNOSIS — R079 Chest pain, unspecified: Secondary | ICD-10-CM

## 2013-07-14 DIAGNOSIS — G4730 Sleep apnea, unspecified: Secondary | ICD-10-CM | POA: Insufficient documentation

## 2013-07-14 DIAGNOSIS — R0609 Other forms of dyspnea: Secondary | ICD-10-CM | POA: Insufficient documentation

## 2013-07-14 DIAGNOSIS — R5383 Other fatigue: Secondary | ICD-10-CM | POA: Insufficient documentation

## 2013-07-14 DIAGNOSIS — R5381 Other malaise: Secondary | ICD-10-CM | POA: Insufficient documentation

## 2013-07-14 DIAGNOSIS — R0689 Other abnormalities of breathing: Secondary | ICD-10-CM

## 2013-07-14 DIAGNOSIS — E669 Obesity, unspecified: Secondary | ICD-10-CM | POA: Insufficient documentation

## 2013-07-14 DIAGNOSIS — R0989 Other specified symptoms and signs involving the circulatory and respiratory systems: Secondary | ICD-10-CM | POA: Insufficient documentation

## 2013-07-14 NOTE — Progress Notes (Signed)
Echocardiogram performed.  

## 2013-07-25 ENCOUNTER — Telehealth: Payer: Self-pay | Admitting: Critical Care Medicine

## 2013-07-25 DIAGNOSIS — R0602 Shortness of breath: Secondary | ICD-10-CM

## 2013-07-25 NOTE — Telephone Encounter (Signed)
Call pt and tell him PFTs show restriction due to weight gain. No asthma or airway obstruction  Need results of sleep study when it is completed

## 2013-07-26 ENCOUNTER — Encounter: Payer: Self-pay | Admitting: Critical Care Medicine

## 2013-07-26 NOTE — Progress Notes (Signed)
Quick Note:  Spoke with pt. Informed him of echo results per Dr. Delford Field. He verbalized understanding. ______

## 2013-07-26 NOTE — Telephone Encounter (Signed)
lmomtcb on pt's home and cell #s   Pt also needs to be informed of echo results:  Notes Recorded by Storm Frisk, MD on 07/25/2013 at 11:03 AM Call pt and tell him Echo was NORMAL

## 2013-07-26 NOTE — Progress Notes (Signed)
Quick Note:  lmomtcb on pt's home and cell #s ______ 

## 2013-07-26 NOTE — Telephone Encounter (Signed)
Called, spoke with pt.  Informed him of echo and PFT results per PW.  He verbalized understanding of this.  Pt states his sleep study is scheduled for this Thursday, Aug 21.  He voiced no further questions or concerns at this time.

## 2013-07-28 ENCOUNTER — Ambulatory Visit (HOSPITAL_BASED_OUTPATIENT_CLINIC_OR_DEPARTMENT_OTHER): Payer: BC Managed Care – PPO | Attending: Critical Care Medicine | Admitting: Radiology

## 2013-07-28 VITALS — Ht 70.0 in | Wt 270.0 lb

## 2013-07-28 DIAGNOSIS — G4733 Obstructive sleep apnea (adult) (pediatric): Secondary | ICD-10-CM

## 2013-07-28 DIAGNOSIS — G4710 Hypersomnia, unspecified: Secondary | ICD-10-CM

## 2013-07-28 DIAGNOSIS — E669 Obesity, unspecified: Secondary | ICD-10-CM | POA: Insufficient documentation

## 2013-07-28 DIAGNOSIS — Z6839 Body mass index (BMI) 39.0-39.9, adult: Secondary | ICD-10-CM | POA: Insufficient documentation

## 2013-07-28 DIAGNOSIS — R0600 Dyspnea, unspecified: Secondary | ICD-10-CM

## 2013-07-28 DIAGNOSIS — R0689 Other abnormalities of breathing: Secondary | ICD-10-CM

## 2013-08-03 ENCOUNTER — Telehealth: Payer: Self-pay | Admitting: Pulmonary Disease

## 2013-08-03 DIAGNOSIS — G4710 Hypersomnia, unspecified: Secondary | ICD-10-CM

## 2013-08-03 DIAGNOSIS — G4730 Sleep apnea, unspecified: Secondary | ICD-10-CM

## 2013-08-03 NOTE — Procedures (Signed)
NAME:  Kenneth Berry, Kenneth Berry NO.:  [id number]  MEDICAL RECORD NO.:  [id number]          PATIENT TYPE:  OUT  LOCATION:  SLEEP CENTER                 FACILITY:  [facility]  PHYSICIAN:  Oretha Milch, MD      DATE OF BIRTH:  [birth year]  DATE OF STUDY:  07/28/2013                           NOCTURNAL POLYSOMNOGRAM  REFERRING PHYSICIAN:  Charlcie Cradle. Delford Field, MD, FCCP  INDICATION FOR STUDY:  [name] is an obese gentleman with hypertension, pedal edema, excessive daytime somnolence, and dyspnea. At the time of this study, he weighed 270 pounds with a height of 5 feet 10 inches, BMI of 39, neck size of 19 inches.  EPWORTH SLEEPINESS SCORE:  5.  This nocturnal polysomnogram was performed with sleep technologist in attendance.  EEG, EOG, EMG, EKG, and respiratory parameters were recorded.  Sleep stages, arousals, limb movements, and respiratory data were scored according to criteria laid out by the American Academy of Sleep Medicine.  SLEEP ARCHITECTURE:  Lights out was at 10:37 p.m., lights on was at 5:10 a.m.  Total sleep time was 344 minutes with a sleep period time of 357 minutes and a sleep efficiency of 88%.  Sleep latency was 35 minutes. Latency to REM sleep was 92 minutes and wake after sleep onset was 13 minutes.  Sleep stages as a percentage of total sleep time was N1 4%, N2 83%, N3 0.6%, and REM sleep 12% (42 minutes).  Supine REM was noted for 40 minutes, only split of REM sleep was around 4 a.m.  AROUSAL DATA:  There were 79 arousals with an arousal index of 14 events per hour.  Of these, 37 were spontaneous and the rest were associated with respiratory events.  RESPIRATORY DATA:  There were 16 obstructive apneas, 5 central apneas, 0 mixed apneas, and 107 hypopneas with an apnea-hypopnea index of 23 events per hour, 22 RERAs were noted with an RDI of 26 events per hour. The longest hypopnea was 55 seconds.  MOVEMENT-PARASOMNIA:  No significant limb movements were  noted.  OXYGEN DATA:  The desaturation index was 25 events per hour.  The lowest desaturation was 84%.  He spent 3 minutes with a saturation less than 88%.  CARDIAC DATA:  The low heart rate was 48 beats per minute.  The high heart rate recorded was an artifact.  No arrhythmias were noted.  DISCUSSION:  He did not meet criteria for CPAP intervention.  Events were also noted in non-supine sleep.  REM sleep seemed to increase the severity of sleep-disordered breathing.  IMPRESSIONS: 1. Moderate obstructive sleep apnea with predominant hypopneas causing     sleep fragmentation and oxygen desaturation. 2. No evidence of cardiac arrhythmias, limb movements, or behavioral     disturbance during sleep.  RECOMMENDATIONS:  1. The treatment options for this degree of sleep disorder breathing     include weight loss and CPAP therapy. 2. He should be cautioned against driving when sleepy. 3. He should be asked to avoid medications with sedative side effects. 4. If he has no comorbidities, auto CPAP can be tried.  Otherwise, a     formal CPAP titration should be scheduled in the lab.  This will     also enable mask desensitization at the same time.     Oretha Milch, MD    RVA/MEDQ  D:  08/03/2013 12:08:46  T:  08/03/2013 20:43:59  Job:  [id number]

## 2013-08-03 NOTE — Telephone Encounter (Signed)
PSG showed moderate OSA- AHI 23/h Suggest CPAP titration study Alternative- autoCPAP  Mindy, pl make FU appt with me

## 2013-08-04 NOTE — Telephone Encounter (Signed)
Call the pt and tell him his sleep study shows sleep apnea We will get him to f/u with Dr Vassie Loll  Order autoset cpap 5-20  Heated humidity mask choice.  Use [facility] or pt DME preference/insurance preference

## 2013-08-04 NOTE — Telephone Encounter (Signed)
lmomtcb x1 for pt 

## 2013-08-04 NOTE — Telephone Encounter (Signed)
Pt aware of results and appt scheduled

## 2013-08-09 ENCOUNTER — Encounter: Payer: Self-pay | Admitting: Critical Care Medicine

## 2013-08-18 ENCOUNTER — Telehealth: Payer: Self-pay | Admitting: Pulmonary Disease

## 2013-08-18 DIAGNOSIS — G4733 Obstructive sleep apnea (adult) (pediatric): Secondary | ICD-10-CM

## 2013-08-18 NOTE — Telephone Encounter (Signed)
Spoke to pt he is aware order was faxed to [facility] today Kenneth Berry

## 2013-08-18 NOTE — Telephone Encounter (Signed)
Do not see order in EPIC. I have placed order to [facility]. Please advise thanks

## 2013-09-08 ENCOUNTER — Encounter: Payer: Self-pay | Admitting: Pulmonary Disease

## 2013-09-08 ENCOUNTER — Ambulatory Visit (INDEPENDENT_AMBULATORY_CARE_PROVIDER_SITE_OTHER): Payer: BC Managed Care – PPO | Admitting: Pulmonary Disease

## 2013-09-08 VITALS — BP 122/78 | HR 95 | Ht 70.0 in | Wt 281.2 lb

## 2013-09-08 DIAGNOSIS — G4733 Obstructive sleep apnea (adult) (pediatric): Secondary | ICD-10-CM | POA: Insufficient documentation

## 2013-09-08 NOTE — Progress Notes (Signed)
  Subjective:    Patient ID: Kenneth Berry, male    DOB: [birth year], 52 y.o.   MRN: [id number]  HPI  52 year old presents for evaluation of obstructive sleep apnea. Hx of dyspnea on exertion progressively worse over several months with associated daytime sleepiness and nocturnal choking and snoring. No assoc chest pain. Notes some leg edema. 10# weight gain over past 1 year. PFTs showed no airway obstruction, and moderate restriction with normal DLCO suggestive of obesity. Echo showed grade 1 diastolic dysfunction  Epworth sleepiness score is 4/24 Bedtime is between 10-11 PM, keep latency is minimal, he sleeps on his right side on his back with 2 pillows, denies frequent awakenings although he has chronic back pain, is out of bed at 7 AM feeling groggy with occasional dryness of mouth and no headaches. He drinks 4-5 cups of coffee every morning.  He underwent UPPP about 10 years ago  PSG showed moderate OSA- AHI 23/h Was set up on CPAP 08/26/13. Pt reports he is wearing his CPAP everynight x 8 hrs a night. His machine is currently on auto. He reports he is somewhat feeling rested during the day but is better than before. He still wakes up tired  CPAP download from 09/08/13 shows excellent control of events with average pressure 11 cm and mild leak. He has good compliance  Past Medical History  Diagnosis Date  . Hypertension   . Sleep apnea     Past Surgical History  Procedure Laterality Date  . Neck fusion    . Tonsillectomy and adenoidectomy    . Uvulopalatopharyngoplasty      No Known Allergies  History   Social History  . Marital Status: Married    Spouse Name: N/A    Number of Children: 2  . Years of Education: N/A   Occupational History  . [employer]    Social History Main Topics  . Smoking status: Former Smoker -- 0.75 packs/day for 10 years    Types: Cigarettes    Quit date: 12/08/1988  . Smokeless tobacco: Not on file  . Alcohol Use: No  . Drug Use: No  .  Sexual Activity: Not on file   Other Topics Concern  . Not on file   Social History Narrative  . No narrative on file    Family History  Problem Relation Age of Onset  . Cancer Maternal Grandfather     lung     Review of Systems  neg for any significant sore throat, dysphagia, itching, sneezing, nasal congestion or excess/ purulent secretions, fever, chills, sweats, unintended wt loss, pleuritic or exertional cp, hempoptysis, orthopnea pnd or change in chronic leg swelling. Also denies presyncope, palpitations, heartburn, abdominal pain, nausea, vomiting, diarrhea or change in bowel or urinary habits, dysuria,hematuria, rash, arthralgias, visual complaints, headache, numbness weakness or ataxia.     Objective:   Physical Exam  Gen. Pleasant, obese, in no distress, normal affect ENT - no lesions, no post nasal drip, class 2-3 airway Neck: No JVD, no thyromegaly, no carotid bruits Lungs: no use of accessory muscles, no dullness to percussion, decreased without rales or rhonchi  Cardiovascular: Rhythm regular, heart sounds  normal, no murmurs or gallops, no peripheral edema Abdomen: soft and non-tender, no hepatosplenomegaly, BS normal. Musculoskeletal: No deformities, no cyanosis or clubbing Neuro:  alert, non focal, no tremors        Assessment & Plan:

## 2013-09-08 NOTE — Patient Instructions (Signed)
Change pressure to 11 cm Recheck download report  Prior to visit

## 2013-09-08 NOTE — Assessment & Plan Note (Signed)
Change to fixed pressure 11 cm and obtain download in one month  The pathophysiology of obstructive sleep apnea , it's cardiovascular consequences & modes of treatment including CPAP were discused with the patient in detail & they evidenced understanding. Weight loss encouraged, compliance with goal of at least 4-6 hrs every night is the expectation. Advised against medications with sedative side effects Cautioned against driving when sleepy - understanding that sleepiness will vary on a day to day basis

## 2013-10-31 ENCOUNTER — Encounter (INDEPENDENT_AMBULATORY_CARE_PROVIDER_SITE_OTHER): Payer: Self-pay

## 2013-10-31 ENCOUNTER — Encounter: Payer: Self-pay | Admitting: Pulmonary Disease

## 2013-10-31 ENCOUNTER — Ambulatory Visit (INDEPENDENT_AMBULATORY_CARE_PROVIDER_SITE_OTHER): Payer: BC Managed Care – PPO | Admitting: Pulmonary Disease

## 2013-10-31 VITALS — BP 110/60 | HR 105 | Ht 70.0 in | Wt 281.8 lb

## 2013-10-31 DIAGNOSIS — G4733 Obstructive sleep apnea (adult) (pediatric): Secondary | ICD-10-CM

## 2013-10-31 DIAGNOSIS — R0602 Shortness of breath: Secondary | ICD-10-CM

## 2013-10-31 NOTE — Progress Notes (Signed)
  Subjective:    Patient ID: Kenneth Berry, male    DOB: August 26, 1961, 52 y.o.   MRN: [id number]  HPI  52 year old presents for FU of obstructive sleep apnea & dyspnea.  Hx of dyspnea on exertion progressively worse over several months with associated daytime sleepiness and nocturnal choking and snoring. No assoc chest pain. Notes some leg edema. 10# weight gain over past 1 year.  PFTs showed no airway obstruction, and moderate restriction with normal DLCO suggestive of obesity.  Echo showed grade 1 diastolic dysfunction    He underwent UPPP about 10 years ago  PSG showed moderate OSA- AHI 23/h   AutoCPAP download from 09/08/13 shows excellent control of events with average pressure 11 cm and mild leak. He has good compliance  10/31/2013 Was set up on CPAP 08/26/13. Pt reports he is wearing his CPAP everynight x 8 hrs a night.  On last visit, changed pr to 11 cm  Bedtime is between 10-11 PM, keep latency is minimal, he sleeps on his right side on his back with 2 pillows, denies frequent awakenings although he has chronic back pain, is out of bed at 7 AM feeling groggy with occasional dryness of mouth and no headaches. He drinks 4-5 cups of coffee every morning.  C/o dyspnea On lasix 20 mg daily, no oncturia  Review of Systems neg for any significant sore throat, dysphagia, itching, sneezing, nasal congestion or excess/ purulent secretions, fever, chills, sweats, unintended wt loss, pleuritic or exertional cp, hempoptysis, orthopnea pnd or change in chronic leg swelling. Also denies presyncope, palpitations, heartburn, abdominal pain, nausea, vomiting, diarrhea or change in bowel or urinary habits, dysuria,hematuria, rash, arthralgias, visual complaints, headache, numbness weakness or ataxia.     Objective:   Physical Exam  Gen. Pleasant, obese, in no distress ENT - no lesions, no post nasal drip Neck: No JVD, no thyromegaly, no carotid bruits Lungs: no use of accessory muscles, no  dullness to percussion, decreased without rales or rhonchi  Cardiovascular: Rhythm regular, heart sounds  normal, no murmurs or gallops, no peripheral edema Musculoskeletal: No deformities, no cyanosis or clubbing , no tremors       Assessment & Plan:

## 2013-10-31 NOTE — Assessment & Plan Note (Signed)
Refer to cardiologist for shortness of breath - diastolic dysfunction May need treadmill stress test ? Deconditioning vs heart failure

## 2013-10-31 NOTE — Assessment & Plan Note (Signed)
Your CPAP is set at 11 cm We will check download on this level  Weight loss encouraged, compliance with goal of at least 4-6 hrs every night is the expectation. Advised against medications with sedative side effects Cautioned against driving when sleepy - understanding that sleepiness will vary on a day to day basis

## 2013-10-31 NOTE — Patient Instructions (Addendum)
Refer to cardiologist for shortness of breath Your CPAP is set at 11 cm We will check download on this level

## 2013-11-01 ENCOUNTER — Encounter: Payer: Self-pay | Admitting: Cardiology

## 2013-11-01 ENCOUNTER — Other Ambulatory Visit: Payer: Self-pay | Admitting: Cardiology

## 2013-11-01 ENCOUNTER — Ambulatory Visit (INDEPENDENT_AMBULATORY_CARE_PROVIDER_SITE_OTHER): Payer: BC Managed Care – PPO | Admitting: Cardiology

## 2013-11-01 VITALS — BP 118/78 | HR 95 | Ht 70.0 in | Wt 280.9 lb

## 2013-11-01 DIAGNOSIS — R0602 Shortness of breath: Secondary | ICD-10-CM

## 2013-11-01 DIAGNOSIS — R5383 Other fatigue: Secondary | ICD-10-CM

## 2013-11-01 DIAGNOSIS — R5381 Other malaise: Secondary | ICD-10-CM

## 2013-11-01 NOTE — Patient Instructions (Signed)
The current medical regimen is effective;  continue present plan and medications.  Please have blood work today (B Nat peptide) Engineer, materials lab.  Your physician has recommended that you have a cardiopulmonary stress test (CPX). CPX testing is a non-invasive measurement of heart and lung function. It replaces a traditional treadmill stress test. This type of test provides a tremendous amount of information that relates not only to your present condition but also for future outcomes. This test combines measurements of you ventilation, respiratory gas exchange in the lungs, electrocardiogram (EKG), blood pressure and physical response before, during, and following an exercise protocol.  Further follow up will be based on these results.

## 2013-11-01 NOTE — Progress Notes (Signed)
HPI The patient describes dyspnea. He has had no prior cardiac history. He does have sleep apnea and is managed for this. He's had pulmonary function testing with some mild abnormalities possibly weight related. However, when an echocardiogram recently suggested perhaps some mild diastolic dysfunction he was referred here for further evaluation. He had a normal systolic function and no valvular abnormalities. There was no suggestion of pulmonary pressures being elevated. His EKG does demonstrate some right axis deviation. He says he is dyspneic walking 50 feet on level ground and that he thinks it is out of proportion to what he is doing. He does not describe PND or orthopnea. He says he did recently get very short of breath when he was visiting his father in the hospital and had to walk 2 flights. He denies any chest pressure, neck or arm discomfort. He has had no palpitations, presyncope or syncope. He does have some mild lower extremity edema.  No Known Allergies  Current Outpatient Prescriptions  Medication Sig Dispense Refill  . acetaminophen (TYLENOL) 325 MG tablet Take 650 mg by mouth every 6 (six) hours as needed for pain.      Marland Kitchen aspirin 81 MG tablet Take 81 mg by mouth daily.        . cetirizine (ZYRTEC) 10 MG tablet Take 10 mg by mouth daily.      . furosemide (LASIX) 20 MG tablet TAKE  ONE TABLET BY MOUTH EVERY MORNING  100 tablet  3  . lisinopril-hydrochlorothiazide (PRINZIDE,ZESTORETIC) 10-12.5 MG per tablet Take 1 tablet by mouth daily.  90 tablet  3  . sildenafil (VIAGRA) 100 MG tablet Take 1 tablet (100 mg total) by mouth as needed for erectile dysfunction.  10 tablet  6   No current facility-administered medications for this visit.    Past Medical History  Diagnosis Date  . Hypertension   . Sleep apnea     Past Surgical History  Procedure Laterality Date  . Neck fusion    . Tonsillectomy and adenoidectomy    . Uvulopalatopharyngoplasty      Family History  Problem  Relation Age of Onset  . Cancer Maternal Grandfather     lung    History   Social History  . Marital Status: Married    Spouse Name: N/A    Number of Children: 2  . Years of Education: N/A   Occupational History  . [employer]    Social History Main Topics  . Smoking status: Former Smoker -- 0.75 packs/day for 10 years    Types: Cigarettes    Quit date: 12/08/1988  . Smokeless tobacco: Not on file  . Alcohol Use: No  . Drug Use: No  . Sexual Activity: Not on file   Other Topics Concern  . Not on file   Social History Narrative  . No narrative on file    ROS:  Positive for decreased hearing, occasional dizziness, reflux, decreased appetite, muscle cramps, back pain.  Otherwise as stated in the HPI and negative for all other systems.  PHYSICAL EXAM BP 118/78  Pulse 95  Ht 5\' 10"  (1.778 m)  Wt 280 lb 14.4 oz (127.415 kg)  BMI 40.30 kg/m2 GENERAL:  Well appearing HEENT:  Pupils equal round and reactive, fundi not visualized, oral mucosa unremarkable NECK:  No jugular venous distention, waveform within normal limits, carotid upstroke brisk and symmetric, no bruits, no thyromegaly LYMPHATICS:  No cervical, inguinal adenopathy LUNGS:  Clear to auscultation bilaterally BACK:  No CVA tenderness CHEST:  Unremarkable HEART:  PMI not displaced or sustained,S1 and S2 within normal limits, no S3, no S4, no clicks, no rubs, no murmurs ABD:  Flat, positive bowel sounds normal in frequency in pitch, no bruits, no rebound, no guarding, no midline pulsatile mass, no hepatomegaly, no splenomegaly EXT:  2 plus pulses throughout, no edema, no cyanosis no clubbing SKIN:  No rashes no nodules NEURO:  Cranial nerves II through XII grossly intact, motor grossly intact throughout [facility]:  Cognitively intact, oriented to person place and time  EKG:  Sinus rhythm, rate 95, right axis deviation , no acute ST-T wave changes 11/01/2013  ASSESSMENT AND PLAN   DYSPNEA:  I suspect that this is  related to weight and deconditioning. There is some mild suggestion of diastolic dysfunction and so I will check a BNP level. I think the most helpful test in this situation would be a cardiopulmonary stress disorder a lung versus vascular versus deconditioning.  Further evaluation and management will be based on this.  HTN:  The blood pressure is at target. No change in medications is indicated. We will continue with therapeutic lifestyle changes (TLC).  OVERWEIGHT:  The patient understands the need to lose weight with diet and exercise. We have discussed specific strategies for this. I suggested the [employer].

## 2013-11-03 ENCOUNTER — Telehealth: Payer: Self-pay | Admitting: Pulmonary Disease

## 2013-11-03 NOTE — Telephone Encounter (Signed)
Download 11/25 - excellent usage No residuals avg pr 11.5 cm Very effective ,no changes, leak ok

## 2013-11-04 NOTE — Telephone Encounter (Signed)
[id number] x1 

## 2013-11-04 NOTE — Telephone Encounter (Signed)
I spoke with patient about results and he verbalized understanding and had no questions 

## 2013-11-07 ENCOUNTER — Encounter (INDEPENDENT_AMBULATORY_CARE_PROVIDER_SITE_OTHER): Payer: BC Managed Care – PPO

## 2013-11-07 DIAGNOSIS — R0602 Shortness of breath: Secondary | ICD-10-CM

## 2013-11-07 DIAGNOSIS — R5383 Other fatigue: Secondary | ICD-10-CM

## 2013-11-17 ENCOUNTER — Telehealth: Payer: Self-pay | Admitting: Cardiology

## 2013-11-17 NOTE — Telephone Encounter (Signed)
New Message ° °Pt calling for results// Please call  °

## 2013-11-17 NOTE — Telephone Encounter (Signed)
Pt called for the CPET results. Pt is aware that Dr. Antoine Poche would like to see pt back to further discuss results. MD has opening on February 5/ 2015. Pt would like for Dr Jenene Slicker nurse, because he wants  to have an earlier appointment. Pt is aware that this message will be sent to MD's nurse for reviewing.

## 2013-11-18 NOTE — Telephone Encounter (Signed)
Lm for pt to call back to discuss which office he prefers to come into since he lives in [place]  Maybe easier to see him in [place] office.

## 2013-11-21 NOTE — Telephone Encounter (Signed)
Follow up ° ° ° ° ° °Returning nurses call °

## 2013-11-21 NOTE — Telephone Encounter (Signed)
Called patient back. He will see Dr.Hochrein in [place] on 12/28/2013 at 130PM. Wants to start a walking program and requesting an approval from Dr.Hochrein before starting this prior to 1/21 appointment. Advised will get a call back ONLY if he does not want him to start a walking program.

## 2013-11-22 NOTE — Telephone Encounter (Signed)
Ok to start walking

## 2013-11-22 NOTE — Telephone Encounter (Signed)
Pt aware.

## 2013-12-15 NOTE — Progress Notes (Signed)
This encounter was created in error - please disregard.

## 2013-12-28 ENCOUNTER — Encounter: Payer: Self-pay | Admitting: Cardiology

## 2013-12-28 ENCOUNTER — Ambulatory Visit (INDEPENDENT_AMBULATORY_CARE_PROVIDER_SITE_OTHER): Payer: BC Managed Care – PPO | Admitting: Cardiology

## 2013-12-28 VITALS — BP 107/60 | Ht 70.0 in | Wt 281.0 lb

## 2013-12-28 DIAGNOSIS — R0602 Shortness of breath: Secondary | ICD-10-CM

## 2013-12-28 NOTE — Patient Instructions (Signed)
The current medical regimen is effective;  continue present plan and medications.  Follow up in 6 months with Dr Hochrein.  You will receive a letter in the mail 2 months before you are due.  Please call us when you receive this letter to schedule your follow up appointment.  

## 2013-12-28 NOTE — Progress Notes (Signed)
I  HPI This patient presents for evaluation of dyspnea. This is extensively described in the previous note.an echocardiogram had suggested possible diastolic dysfunction. However, after I saw him a BNP level was very normal in he had a cardiopulmonary stress test. He went over 8 minutes and there was some mild possible cardiac limitation at peak exercise but overall it was a relatively normal study not suggesting a primary pulmonary etiology. He continues to have the same dyspnea. However, he has not been able to lose weight. He hasn't exercise routinely because of his back. He denies any new symptoms such as chest pressure, neck or arm discomfort. He's not had any new palpitations, presyncope or syncope.   No Known Allergies  Current Outpatient Prescriptions  Medication Sig Dispense Refill  . acetaminophen (TYLENOL) 325 MG tablet Take 650 mg by mouth every 6 (six) hours as needed for pain.      Marland Kitchen aspirin 81 MG tablet Take 81 mg by mouth daily.        . cetirizine (ZYRTEC) 10 MG tablet Take 10 mg by mouth daily.      . furosemide (LASIX) 20 MG tablet TAKE  ONE TABLET BY MOUTH EVERY MORNING  100 tablet  3  . lisinopril-hydrochlorothiazide (PRINZIDE,ZESTORETIC) 10-12.5 MG per tablet Take 1 tablet by mouth daily.  90 tablet  3  . sildenafil (VIAGRA) 100 MG tablet Take 1 tablet (100 mg total) by mouth as needed for erectile dysfunction.  10 tablet  6   No current facility-administered medications for this visit.    Past Medical History  Diagnosis Date  . Hypertension   . Sleep apnea     Past Surgical History  Procedure Laterality Date  . Neck fusion    . Tonsillectomy and adenoidectomy    . Uvulopalatopharyngoplasty      ROS:  Positive for decreased hearing, occasional dizziness, reflux, decreased appetite, muscle cramps, back pain.  Otherwise as stated in the HPI and negative for all other systems.  PHYSICAL EXAM BP 107/60  Ht 5\' 10"  (1.778 m)  Wt 281 lb (127.461 kg)  BMI 40.32  kg/m2 GENERAL:  Well appearing NECK:  No jugular venous distention, waveform within normal limits, carotid upstroke brisk and symmetric, no bruits, no thyromegaly LUNGS:  Clear to auscultation bilaterally BACK:  No CVA tenderness CHEST:  Unremarkable HEART:  PMI not displaced or sustained,S1 and S2 within normal limits, no S3, no S4, no clicks, no rubs, no murmurs ABD:  Flat, positive bowel sounds normal in frequency in pitch, no bruits, no rebound, no guarding, no midline pulsatile mass, no hepatomegaly, no splenomegaly, morbidly obese EXT:  2 plus pulses throughout, no edema, no cyanosis no clubbing    ASSESSMENT AND PLAN   DYSPNEA:  After the evaluation he has had I suspect that his dyspnea is more related to his weight and deconditioning. We had a very long discussion about this. At this point I don't see this further cardiovascular testing assess for fusion imaging or invasive study be high yield. We discussed very specific strategies for weight loss and a goal for the next 6 months with increased activity. If he able to achieve weight loss and increased activity and he is more short of breath for the last.   I wouldn't pursue further workup.  HTN:  The blood pressure is at target. No change in medications is indicated. We will continue with therapeutic lifestyle changes (TLC).  OVERWEIGHT:  As above.

## 2014-02-28 LAB — PULMONARY FUNCTION TEST
DL/VA % pred: 122 %
DL/VA: 5.7 ml/min/mmHg/L
DLCO unc % pred: 87 %
DLCO unc: 28.4 ml/min/mmHg
FEF 25-75 Post: 4.33 L/sec
FEF 25-75 Pre: 3.34 L/sec
FEF2575-%Change-Post: 29 %
FEF2575-%Pred-Post: 128 %
FEF2575-%Pred-Pre: 99 %
FEV1-%Change-Post: 3 %
FEV1-%Pred-Post: 79 %
FEV1-%Pred-Pre: 77 %
FEV1-Post: 3.08 L
FEV1-Pre: 2.98 L
FEV1FVC-%Change-Post: 2 %
FEV1FVC-%Pred-Pre: 110 %
FEV6-%Change-Post: 0 %
FEV6-%Pred-Post: 73 %
FEV6-%Pred-Pre: 72 %
FEV6-Post: 3.54 L
FEV6-Pre: 3.51 L
FEV6FVC-%Pred-Post: 104 %
FEV6FVC-%Pred-Pre: 104 %
FVC-%Change-Post: 0 %
FVC-%Pred-Post: 70 %
FVC-%Pred-Pre: 70 %
FVC-Post: 3.54 L
Post FEV1/FVC ratio: 87 %
Post FEV6/FVC ratio: 100 %
Pre FEV1/FVC ratio: 85 %
Pre FEV6/FVC Ratio: 100 %
RV % pred: 78 %
RV: 1.63 L
TLC % pred: 47 %
TLC: 3.33 L

## 2014-04-18 ENCOUNTER — Other Ambulatory Visit: Payer: Self-pay | Admitting: Family Medicine

## 2014-04-24 ENCOUNTER — Other Ambulatory Visit: Payer: Self-pay | Admitting: Family Medicine

## 2014-07-24 ENCOUNTER — Encounter: Payer: Self-pay | Admitting: Family Medicine

## 2014-07-24 ENCOUNTER — Ambulatory Visit (INDEPENDENT_AMBULATORY_CARE_PROVIDER_SITE_OTHER): Payer: BC Managed Care – PPO | Admitting: Family Medicine

## 2014-07-24 VITALS — BP 110/70 | Temp 98.5°F | Wt 279.0 lb

## 2014-07-24 DIAGNOSIS — N529 Male erectile dysfunction, unspecified: Secondary | ICD-10-CM

## 2014-07-24 DIAGNOSIS — IMO0002 Reserved for concepts with insufficient information to code with codable children: Secondary | ICD-10-CM

## 2014-07-24 LAB — CBC WITH DIFFERENTIAL/PLATELET
Basophils Absolute: 0.1 10*3/uL (ref 0.0–0.1)
Basophils Relative: 0.5 % (ref 0.0–3.0)
EOS PCT: 1.6 % (ref 0.0–5.0)
Eosinophils Absolute: 0.2 10*3/uL (ref 0.0–0.7)
HEMATOCRIT: 43.2 % (ref 39.0–52.0)
HEMOGLOBIN: 14.7 g/dL (ref 13.0–17.0)
LYMPHS ABS: 2.7 10*3/uL (ref 0.7–4.0)
Lymphocytes Relative: 21.3 % (ref 12.0–46.0)
MCHC: 34 g/dL (ref 30.0–36.0)
MCV: 83.1 fl (ref 78.0–100.0)
MONO ABS: 0.8 10*3/uL (ref 0.1–1.0)
MONOS PCT: 6 % (ref 3.0–12.0)
NEUTROS ABS: 8.9 10*3/uL — AB (ref 1.4–7.7)
Neutrophils Relative %: 70.6 % (ref 43.0–77.0)
Platelets: 228 10*3/uL (ref 150.0–400.0)
RBC: 5.2 Mil/uL (ref 4.22–5.81)
RDW: 13.8 % (ref 11.5–15.5)
WBC: 12.6 10*3/uL — AB (ref 4.0–10.5)

## 2014-07-24 LAB — POCT URINALYSIS DIPSTICK
BILIRUBIN UA: NEGATIVE
Glucose, UA: NEGATIVE
KETONES UA: NEGATIVE
Leukocytes, UA: NEGATIVE
Nitrite, UA: NEGATIVE
PH UA: 7
PROTEIN UA: NEGATIVE
RBC UA: NEGATIVE
SPEC GRAV UA: 1.015
Urobilinogen, UA: 0.2

## 2014-07-24 LAB — BASIC METABOLIC PANEL
BUN: 18 mg/dL (ref 6–23)
CHLORIDE: 100 meq/L (ref 96–112)
CO2: 30 meq/L (ref 19–32)
CREATININE: 1.3 mg/dL (ref 0.4–1.5)
Calcium: 9.3 mg/dL (ref 8.4–10.5)
GFR: 63.01 mL/min (ref >60.00)
GLUCOSE: 100 mg/dL — AB (ref 70–99)
POTASSIUM: 3.9 meq/L (ref 3.5–5.1)
Sodium: 139 mEq/L (ref 135–145)

## 2014-07-24 LAB — HEPATIC FUNCTION PANEL
ALT: 24 U/L (ref 0–53)
AST: 20 U/L (ref 0–37)
Albumin: 4.3 g/dL (ref 3.5–5.2)
Alkaline Phosphatase: 54 U/L (ref 39–117)
BILIRUBIN DIRECT: 0.1 mg/dL (ref 0.0–0.3)
BILIRUBIN TOTAL: 0.6 mg/dL (ref 0.2–1.2)
Total Protein: 7 g/dL (ref 6.0–8.3)

## 2014-07-24 LAB — LIPID PANEL
Cholesterol: 148 mg/dL (ref 0–200)
HDL: 26.6 mg/dL — AB (ref >39.00)
NONHDL: 121.4
Total CHOL/HDL Ratio: 6
Triglycerides: 233 mg/dL — ABNORMAL HIGH (ref 0.0–149.0)
VLDL: 46.6 mg/dL — AB (ref 0.0–40.0)

## 2014-07-24 MED ORDER — FUROSEMIDE 20 MG PO TABS: ORAL_TABLET | ORAL | Status: DC

## 2014-07-24 MED ORDER — SILDENAFIL CITRATE 20 MG PO TABS: 20.0000 mg | ORAL_TABLET | Freq: Three times a day (TID) | ORAL | Status: DC

## 2014-07-24 MED ORDER — LISINOPRIL-HYDROCHLOROTHIAZIDE 10-12.5 MG PO TABS: 1.0000 | ORAL_TABLET | Freq: Every day | ORAL | Status: DC

## 2014-07-24 NOTE — Progress Notes (Signed)
   Subjective:    Patient ID: Kenneth Berry, male    DOB: Jul 17, 1961, 53 y.o.   MRN: [id number]  HPI Ishmail is a 53 year old married male nonsmoker who comes in today for evaluation of hypertension and erectile dysfunction  His BP is 110/70 on lisinopril 10 days 12.5. He also takes 20 mg of Lasix daily because of venous insufficiency and peripheral edema  He uses Viagra when necessary  He also has a fungal infection of right index finger and toenails.   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

## 2014-07-24 NOTE — Progress Notes (Signed)
Pre visit review using our clinic review tool, if applicable. No additional management support is needed unless otherwise documented below in the visit note. 

## 2014-07-24 NOTE — Patient Instructions (Signed)
Continue the current medications  revatio 20 mg...........Marland Kitchen  Labs today  Return in September for your annual exam

## 2014-07-25 ENCOUNTER — Telehealth: Payer: Self-pay | Admitting: Family Medicine

## 2014-07-25 LAB — LDL CHOLESTEROL, DIRECT: LDL DIRECT: 97.2 mg/dL

## 2014-07-25 LAB — PSA: PSA: 0.33 ng/mL (ref 0.10–4.00)

## 2014-07-25 LAB — TSH: TSH: 1.79 u[IU]/mL (ref 0.35–4.50)

## 2014-07-25 NOTE — Telephone Encounter (Signed)
Relevant patient education mailed to patient.  

## 2014-08-08 ENCOUNTER — Other Ambulatory Visit: Payer: BC Managed Care – PPO

## 2014-08-15 ENCOUNTER — Encounter: Payer: Self-pay | Admitting: Family Medicine

## 2014-08-15 ENCOUNTER — Ambulatory Visit (INDEPENDENT_AMBULATORY_CARE_PROVIDER_SITE_OTHER): Payer: BC Managed Care – PPO | Admitting: Family Medicine

## 2014-08-15 VITALS — BP 110/70 | Temp 98.7°F | Ht 70.25 in | Wt 271.0 lb

## 2014-08-15 DIAGNOSIS — N529 Male erectile dysfunction, unspecified: Secondary | ICD-10-CM

## 2014-08-15 DIAGNOSIS — Z23 Encounter for immunization: Secondary | ICD-10-CM

## 2014-08-15 DIAGNOSIS — G8929 Other chronic pain: Secondary | ICD-10-CM | POA: Insufficient documentation

## 2014-08-15 DIAGNOSIS — G4733 Obstructive sleep apnea (adult) (pediatric): Secondary | ICD-10-CM

## 2014-08-15 DIAGNOSIS — M545 Low back pain, unspecified: Secondary | ICD-10-CM

## 2014-08-15 DIAGNOSIS — IMO0002 Reserved for concepts with insufficient information to code with codable children: Secondary | ICD-10-CM

## 2014-08-15 DIAGNOSIS — R609 Edema, unspecified: Secondary | ICD-10-CM

## 2014-08-15 MED ORDER — TRAMADOL HCL 50 MG PO TABS: ORAL_TABLET | ORAL | Status: DC

## 2014-08-15 MED ORDER — CYCLOBENZAPRINE HCL 10 MG PO TABS: ORAL_TABLET | ORAL | Status: DC

## 2014-08-15 NOTE — Progress Notes (Signed)
Pre visit review using our clinic review tool, if applicable. No additional management support is needed unless otherwise documented below in the visit note. 

## 2014-08-15 NOTE — Progress Notes (Signed)
   Subjective:    Patient ID: Kenneth Berry, male    DOB: 1961-10-22, 53 y.o.   MRN: [id number]  HPI Kenneth Berry is a 53 year old married male nonsmoker who comes in today for general physical examination  His biggest problem now is his chronic back pain. It started about 15 years ago. He saw Dr. Carloyn Manner at the time and was deemed not a surgical candidate. Over the last couple months and years it seems to have gotten worse. Last week it was so bad he couldn't get out of bed. I recommend he call and go back to see the neurosurgeon  He takes Lasix 20 mg daily for venous insufficiency  Zestoretic 10 days 12.5 daily for hypertension BP 110/70  He uses a generic 20 mg Viagra.  Had a recent cardiac evaluation by Dr. Lenore Cordia......... all normal  Vaccinations up-to-date tetanus booster and flu shot given by Apolonio Schneiders   Review of Systems  Constitutional: Negative.   HENT: Negative.   Eyes: Negative.   Respiratory: Negative.   Cardiovascular: Negative.   Gastrointestinal: Negative.   Genitourinary: Negative.   Musculoskeletal: Negative.   Skin: Negative.   Neurological: Negative.   Psychiatric/Behavioral: Negative.        Objective:   Physical Exam  Nursing note and vitals reviewed. Constitutional: He is oriented to person, place, and time. He appears well-developed and well-nourished.  HENT:  Head: Normocephalic and atraumatic.  Right Ear: External ear normal.  Left Ear: External ear normal.  Nose: Nose normal.  Mouth/Throat: Oropharynx is clear and moist.  Eyes: Conjunctivae and EOM are normal. Pupils are equal, round, and reactive to light.  Neck: Normal range of motion. Neck supple. No JVD present. No tracheal deviation present. No thyromegaly present.  Cardiovascular: Normal rate, regular rhythm, normal heart sounds and intact distal pulses.  Exam reveals no gallop and no friction rub.   No murmur heard. Aortic bruits peripheral pulses 2+ and symmetrical  Pulmonary/Chest: Effort normal  and breath sounds normal. No stridor. No respiratory distress. He has no wheezes. He has no rales. He exhibits no tenderness.  Abdominal: Soft. Bowel sounds are normal. He exhibits no distension and no mass. There is no tenderness. There is no rebound and no guarding.  Genitourinary: Rectum normal, prostate normal and penis normal. Guaiac negative stool. No penile tenderness.  Musculoskeletal: Normal range of motion. He exhibits no edema and no tenderness.  Lymphadenopathy:    He has no cervical adenopathy.  Neurological: He is alert and oriented to person, place, and time. He has normal reflexes. No cranial nerve deficit. He exhibits normal muscle tone.  Skin: Skin is warm and dry. No rash noted. No erythema. No pallor.  Psychiatric: He has a normal mood and affect. His behavior is normal. Judgment and thought content normal.          Assessment & Plan:  Healthy male  History of sleep apnea........ continue nightly CPAP followed by pulmonary......... again recommended weight loss  Chronic low back pain.........Marland Kitchen refer back to neurosurgery  Hypertension at goal continue current therapy  Venous insufficiency continue Lasix 2020 mg daily in the morning  Erectile dysfunction generic 20 mg Viagra

## 2014-08-15 NOTE — Patient Instructions (Signed)
Continue your current medications  Followup in 1 year sooner if any problems  Reconsult with neurosurgery about your back pain  Work heart this year to lose 15 pounds

## 2015-01-06 ENCOUNTER — Emergency Department (HOSPITAL_COMMUNITY): Payer: 59

## 2015-01-06 ENCOUNTER — Encounter (HOSPITAL_COMMUNITY): Payer: Self-pay | Admitting: Emergency Medicine

## 2015-01-06 ENCOUNTER — Emergency Department (HOSPITAL_COMMUNITY)
Admission: EM | Admit: 2015-01-06 | Discharge: 2015-01-06 | Disposition: A | Discharge status: Home / Self Care | Payer: 59 | Attending: Emergency Medicine | Admitting: Emergency Medicine

## 2015-01-06 DIAGNOSIS — Z79899 Other long term (current) drug therapy: Secondary | ICD-10-CM | POA: Insufficient documentation

## 2015-01-06 DIAGNOSIS — R42 Dizziness and giddiness: Secondary | ICD-10-CM | POA: Insufficient documentation

## 2015-01-06 DIAGNOSIS — I10 Essential (primary) hypertension: Secondary | ICD-10-CM | POA: Insufficient documentation

## 2015-01-06 DIAGNOSIS — R000 Tachycardia, unspecified: Secondary | ICD-10-CM | POA: Diagnosis not present

## 2015-01-06 DIAGNOSIS — Z8669 Personal history of other diseases of the nervous system and sense organs: Secondary | ICD-10-CM | POA: Diagnosis not present

## 2015-01-06 DIAGNOSIS — M549 Dorsalgia, unspecified: Secondary | ICD-10-CM | POA: Insufficient documentation

## 2015-01-06 DIAGNOSIS — Z87891 Personal history of nicotine dependence: Secondary | ICD-10-CM | POA: Insufficient documentation

## 2015-01-06 DIAGNOSIS — Z7982 Long term (current) use of aspirin: Secondary | ICD-10-CM | POA: Diagnosis not present

## 2015-01-06 DIAGNOSIS — M7989 Other specified soft tissue disorders: Secondary | ICD-10-CM | POA: Diagnosis not present

## 2015-01-06 DIAGNOSIS — R0602 Shortness of breath: Secondary | ICD-10-CM | POA: Insufficient documentation

## 2015-01-06 LAB — COMPREHENSIVE METABOLIC PANEL
ALK PHOS: 53 U/L (ref 39–117)
ALT: 24 U/L (ref 0–53)
ANION GAP: 10 (ref 5–15)
AST: 21 U/L (ref 0–37)
Albumin: 4.3 g/dL (ref 3.5–5.2)
BILIRUBIN TOTAL: 0.6 mg/dL (ref 0.3–1.2)
BUN: 14 mg/dL (ref 6–23)
CHLORIDE: 104 mmol/L (ref 96–112)
CO2: 25 mmol/L (ref 19–32)
Calcium: 9.2 mg/dL (ref 8.4–10.5)
Creatinine, Ser: 1.06 mg/dL (ref 0.50–1.35)
GFR calc non Af Amer: 78 mL/min — ABNORMAL LOW (ref >90)
Glucose, Bld: 116 mg/dL — ABNORMAL HIGH (ref 70–99)
Potassium: 3.6 mmol/L (ref 3.5–5.1)
SODIUM: 139 mmol/L (ref 135–145)
Total Protein: 7.5 g/dL (ref 6.0–8.3)

## 2015-01-06 LAB — CBC WITH DIFFERENTIAL/PLATELET
BASOS ABS: 0 10*3/uL (ref 0.0–0.1)
Basophils Relative: 0 % (ref 0–1)
Eosinophils Absolute: 0.1 10*3/uL (ref 0.0–0.7)
Eosinophils Relative: 1 % (ref 0–5)
HCT: 42.9 % (ref 39.0–52.0)
Hemoglobin: 14.3 g/dL (ref 13.0–17.0)
LYMPHS ABS: 1.6 10*3/uL (ref 0.7–4.0)
LYMPHS PCT: 16 % (ref 12–46)
MCH: 27.8 pg (ref 26.0–34.0)
MCHC: 33.3 g/dL (ref 30.0–36.0)
MCV: 83.3 fL (ref 78.0–100.0)
Monocytes Absolute: 0.5 10*3/uL (ref 0.1–1.0)
Monocytes Relative: 5 % (ref 3–12)
Neutro Abs: 7.7 10*3/uL (ref 1.7–7.7)
Neutrophils Relative %: 78 % — ABNORMAL HIGH (ref 43–77)
PLATELETS: 179 10*3/uL (ref 150–400)
RBC: 5.15 MIL/uL (ref 4.22–5.81)
RDW: 13.5 % (ref 11.5–15.5)
WBC: 10 10*3/uL (ref 4.0–10.5)

## 2015-01-06 LAB — D-DIMER, QUANTITATIVE (NOT AT ARMC): D DIMER QUANT: 1.93 ug{FEU}/mL — AB (ref 0.00–0.48)

## 2015-01-06 LAB — TROPONIN I

## 2015-01-06 MED ORDER — SODIUM CHLORIDE 0.9 % IV BOLUS (SEPSIS)
250.0000 mL | Freq: Once | INTRAVENOUS | Status: AC
  Administered 2015-01-06: 250 mL via INTRAVENOUS

## 2015-01-06 MED ORDER — SODIUM CHLORIDE 0.9 % IV SOLN
INTRAVENOUS | Status: DC
  Administered 2015-01-06: 750 mL via INTRAVENOUS

## 2015-01-06 MED ORDER — SODIUM CHLORIDE 0.9 % IJ SOLN
INTRAMUSCULAR | Status: AC
  Filled 2015-01-06: qty 500

## 2015-01-06 MED ORDER — IOHEXOL 350 MG/ML SOLN
100.0000 mL | Freq: Once | INTRAVENOUS | Status: AC | PRN
  Administered 2015-01-06: 100 mL via INTRAVENOUS

## 2015-01-06 MED ORDER — SODIUM CHLORIDE 0.9 % IJ SOLN
INTRAMUSCULAR | Status: AC
  Filled 2015-01-06: qty 30

## 2015-01-06 MED ORDER — IPRATROPIUM-ALBUTEROL 0.5-2.5 (3) MG/3ML IN SOLN
3.0000 mL | Freq: Once | RESPIRATORY_TRACT | Status: AC
  Administered 2015-01-06: 3 mL via RESPIRATORY_TRACT
  Filled 2015-01-06: qty 3

## 2015-01-06 NOTE — Discharge Instructions (Signed)
Extensive workup here without any significant findings. As we discussed CT scan did show evidence of a pulmonary nodule which will require follow-up in the future. Your primary care doctor can help you with that. Return for any new or worse symptoms.

## 2015-01-06 NOTE — ED Provider Notes (Signed)
CSN: [id number]     Arrival date & time 01/06/15  1156 History  This chart was scribed for Fredia Sorrow, MD by Peyton Bottoms, ED Scribe. This patient was seen in room APA19/APA19 and the patient's care was started at 1:38 PM.   Chief Complaint  Patient presents with  . Shortness of Breath   Patient is a 54 y.o. male presenting with shortness of breath. The history is provided by the patient. No language interpreter was used.  Shortness of Breath Severity:  Moderate Onset quality:  Sudden Progression:  Partially resolved Chronicity:  New Context comment:  Dust Relieved by:  Nothing Worsened by:  Nothing tried Ineffective treatments:  None tried Associated symptoms: no abdominal pain, no chest pain, no cough, no diaphoresis, no fever, no headaches, no neck pain, no rash, no sore throat, no vomiting and no wheezing    HPI Comments: Kenneth Berry is a 54 y.o. male with a PMHx of hypertension, brought in by EMS, who presents to the Emergency Department complaining of moderate SOB that began earlier today. He states that he was working under the house and was crawling on the floor. He states he felt claustrophobic at the moment and felt like he was hyperventilating. Patient states that he was out from under the house 10-15 minutes before EMS arrived. EMS had normal O2 saturation for patient. Patient states he is currently out of breath when speaking. He feels like he "has a hard time catching his breath". He states that he has had similar symptoms int he past. Patient was seen by cardiologist and had a stress test done 1.5 years ago. Patient is seen by Dr. Stevie Kern. He denies associated chest pain, chest tightness, wheezing or cough.   Past Medical History  Diagnosis Date  . Hypertension   . Sleep apnea    Past Surgical History  Procedure Laterality Date  . Neck fusion    . Tonsillectomy and adenoidectomy    . Uvulopalatopharyngoplasty     Family History  Problem Relation Age  of Onset  . Cancer Maternal Grandfather     lung  . Arrhythmia Father     atrial flutter   History  Substance Use Topics  . Smoking status: Former Smoker -- 0.75 packs/day for 10 years    Types: Cigarettes    Quit date: 12/08/1988  . Smokeless tobacco: Never Used  . Alcohol Use: No   Review of Systems  Constitutional: Negative for fever, chills and diaphoresis.  HENT: Negative for congestion, rhinorrhea and sore throat.   Eyes: Negative for visual disturbance.  Respiratory: Positive for shortness of breath. Negative for cough and wheezing.   Cardiovascular: Negative for chest pain and leg swelling.       Leg swelling at baseline  Gastrointestinal: Negative for nausea, vomiting, abdominal pain and diarrhea.  Genitourinary: Negative for dysuria.  Musculoskeletal: Negative for back pain and neck pain.       Back pain at baseline.  Skin: Negative for rash.  Neurological: Positive for dizziness. Negative for syncope and headaches.  Hematological: Does not bruise/bleed easily.  Psychiatric/Behavioral: Negative for confusion.   Allergies  Review of patient's allergies indicates no known allergies.  Home Medications   Prior to Admission medications   Medication Sig Start Date End Date Taking? Authorizing Provider  aspirin EC 81 MG tablet Take 81 mg by mouth daily.   Yes Historical Provider, MD  cetirizine (ZYRTEC) 10 MG tablet Take 10 mg by mouth daily.   Yes Historical  Provider, MD  furosemide (LASIX) 20 MG tablet TAKE ONE TABLET BY MOUTH EVERY MORNING 07/24/14  Yes Dorena Cookey, MD  lisinopril-hydrochlorothiazide (PRINZIDE,ZESTORETIC) 10-12.5 MG per tablet Take 1 tablet by mouth daily. 07/24/14  Yes Dorena Cookey, MD  sildenafil (REVATIO) 20 MG tablet Take 1 tablet (20 mg total) by mouth 3 (three) times daily. 07/24/14  Yes Dorena Cookey, MD  traMADol (ULTRAM) 50 MG tablet 1 by mouth each bedtime for back pain Patient taking differently: Take 50 mg by mouth at bedtime as needed.  back pain 08/15/14  Yes Dorena Cookey, MD  acetaminophen (TYLENOL) 325 MG tablet Take 650 mg by mouth every 6 (six) hours as needed for pain.    Historical Provider, MD  cyclobenzaprine (FLEXERIL) 10 MG tablet 1 by mouth each bedtime when necessary Patient taking differently: Take 10 mg by mouth daily as needed for muscle spasms.  08/15/14   Dorena Cookey, MD   Triage Vitals: BP 135/92 mmHg  Pulse 101  Temp(Src) 98.2 F (36.8 C) (Oral)  Resp 21  Ht 5' 10.5" (1.791 m)  Wt 270 lb (122.471 kg)  BMI 38.18 kg/m2  SpO2 96%  Physical Exam  Constitutional: He is oriented to person, place, and time. He appears well-developed and well-nourished.  HENT:  Head: Normocephalic and atraumatic.  Eyes: Pupils are equal, round, and reactive to light.  Cardiovascular: Normal rate, regular rhythm and normal heart sounds.   No murmur heard. Pulmonary/Chest: Effort normal and breath sounds normal. He has no wheezes.  Abdominal: Bowel sounds are normal. There is no tenderness.  Musculoskeletal: Normal range of motion. He exhibits no edema.  Neurological: He is alert and oriented to person, place, and time. No cranial nerve deficit. He exhibits normal muscle tone. Coordination normal.  Skin: Skin is warm and dry. No erythema.  Psychiatric: He has a normal mood and affect. His behavior is normal.  Nursing note and vitals reviewed.  ED Course  Procedures (including critical care time)  Medications  0.9 %  sodium chloride infusion (750 mLs Intravenous New Bag/Given 01/06/15 1513)  sodium chloride 0.9 % injection (not administered)  sodium chloride 0.9 % injection (not administered)  ipratropium-albuterol (DUONEB) 0.5-2.5 (3) MG/3ML nebulizer solution 3 mL (3 mLs Nebulization Given 01/06/15 1441)  sodium chloride 0.9 % bolus 250 mL (250 mLs Intravenous New Bag/Given 01/06/15 1512)  iohexol (OMNIPAQUE) 350 MG/ML injection 100 mL (100 mLs Intravenous Contrast Given 01/06/15 1523)    DIAGNOSTIC STUDIES: Oxygen  Saturation is 96% on RA, normal by my interpretation.    COORDINATION OF CARE: 1:54 PM- Discussed plans to order diagnostic CXR, EKG and lab work. Pt advised of plan for treatment and pt agrees.  Results for orders placed or performed during the hospital encounter of 01/06/15  Troponin I  Result Value Ref Range   Troponin I <0.03 <0.031 ng/mL  CBC with Differential  Result Value Ref Range   WBC 10.0 4.0 - 10.5 K/uL   RBC 5.15 4.22 - 5.81 MIL/uL   Hemoglobin 14.3 13.0 - 17.0 g/dL   HCT 42.9 39.0 - 52.0 %   MCV 83.3 78.0 - 100.0 fL   MCH 27.8 26.0 - 34.0 pg   MCHC 33.3 30.0 - 36.0 g/dL   RDW 13.5 11.5 - 15.5 %   Platelets 179 150 - 400 K/uL   Neutrophils Relative % 78 (H) 43 - 77 %   Neutro Abs 7.7 1.7 - 7.7 K/uL   Lymphocytes Relative 16 12 -  46 %   Lymphs Abs 1.6 0.7 - 4.0 K/uL   Monocytes Relative 5 3 - 12 %   Monocytes Absolute 0.5 0.1 - 1.0 K/uL   Eosinophils Relative 1 0 - 5 %   Eosinophils Absolute 0.1 0.0 - 0.7 K/uL   Basophils Relative 0 0 - 1 %   Basophils Absolute 0.0 0.0 - 0.1 K/uL  Comprehensive metabolic panel  Result Value Ref Range   Sodium 139 135 - 145 mmol/L   Potassium 3.6 3.5 - 5.1 mmol/L   Chloride 104 96 - 112 mmol/L   CO2 25 19 - 32 mmol/L   Glucose, Bld 116 (H) 70 - 99 mg/dL   BUN 14 6 - 23 mg/dL   Creatinine, Ser 1.06 0.50 - 1.35 mg/dL   Calcium 9.2 8.4 - 10.5 mg/dL   Total Protein 7.5 6.0 - 8.3 g/dL   Albumin 4.3 3.5 - 5.2 g/dL   AST 21 0 - 37 U/L   ALT 24 0 - 53 U/L   Alkaline Phosphatase 53 39 - 117 U/L   Total Bilirubin 0.6 0.3 - 1.2 mg/dL   GFR calc non Af Amer 78 (L) >90 mL/min   GFR calc Af Amer >90 >90 mL/min   Anion gap 10 5 - 15  D-dimer, quantitative  Result Value Ref Range   D-Dimer, Quant 1.93 (H) 0.00 - 0.48 ug/mL-FEU  Troponin I  Result Value Ref Range   Troponin I <0.03 <0.031 ng/mL   Dg Chest 2 View  01/06/2015   CLINICAL DATA:  Short of breath.  EXAM: CHEST  2 VIEW  COMPARISON:  06/16/2013  FINDINGS: Cardiac silhouette  normal in size and configuration. No mediastinal or hilar masses or evidence of adenopathy.  There are prominent bronchovascular markings bilaterally. There is chronic appearing bronchial wall thickening in the medial lower lobes. No lung consolidation or edema. No pleural effusion or pneumothorax.  Status post anterior cervical spine fusion.  Bony thorax is intact.  IMPRESSION: No acute cardiopulmonary disease.   Electronically Signed   By: Lajean Manes M.D.   On: 01/06/2015 13:15   Ct Angio Chest Pe W/cm &/or Wo Cm  01/06/2015   CLINICAL DATA:  54 year old male with sudden onset of shortness of breath.  EXAM: CT ANGIOGRAPHY CHEST WITH CONTRAST  TECHNIQUE: Multidetector CT imaging of the chest was performed using the standard protocol during bolus administration of intravenous contrast. Multiplanar CT image reconstructions and MIPs were obtained to evaluate the vascular anatomy.  CONTRAST:  116mL OMNIPAQUE IOHEXOL 350 MG/ML SOLN  COMPARISON:  No priors.  FINDINGS: Mediastinum/Lymph Nodes: No filling defects within the pulmonary arterial tree to suggest underlying pulmonary embolism. Heart size is normal. There is no significant pericardial fluid, thickening or pericardial calcification. No pathologically enlarged mediastinal or hilar lymph nodes. Esophagus is unremarkable in appearance. No axillary lymphadenopathy.  Lungs/Pleura: 3 mm subpleural nodule in the right middle lobe (image 71 of series 6). No other larger more suspicious appearing pulmonary nodules or masses are otherwise noted. No acute consolidative airspace disease. No pleural effusions.  Musculoskeletal/Soft Tissues: There are no aggressive appearing lytic or blastic lesions noted in the visualized portions of the skeleton. Orthopedic fixation hardware in the lower cervical spine incompletely imaged.  Upper Abdomen: Incompletely imaged low-attenuation lesion in the upper right retroperitoneum measuring at least 4.8 cm in diameter, likely to  represent an exophytic cyst in the upper pole of the right kidney (but incompletely characterized). Diffuse low attenuation throughout the hepatic parenchyma, compatible  with hepatic steatosis.  Review of the MIP images confirms the above findings.  IMPRESSION: 1. No evidence of pulmonary embolism. 2. No acute findings in the thorax to account for the patient's symptoms. 3. Tiny 3 mm subpleural nodule in the right middle lobe. Statistically, this is likely to represent a subpleural lymph node. If the patient is at high risk for bronchogenic carcinoma, follow-up chest CT at 1 year is recommended. If the patient is at low risk, no follow-up is needed. This recommendation follows the consensus statement: Guidelines for Management of Small Pulmonary Nodules Detected on CT Scans: A Statement from the [employer] as published in Radiology 2005; 237:395-400. 4. Hepatic steatosis. 5. Cystic-appearing lesion in the upper right retroperitoneum, likely an exophytic cyst off the upper pole of the right kidney. This could be further evaluated with followup nonemergent abdominal ultrasound if of clinical concern.   Electronically Signed   By: Vinnie Langton M.D.   On: 01/06/2015 15:49   MDM   Final diagnoses:  SOB (shortness of breath)    Extensive workup here today for the shortness of breath. There was some concern based on the elevated d-dimer that there may have been a pulmonary embolus but that was ruled out by CT angios. Patient was initially a little tachycardic that was the risk factor for doing the d-dimer. Patient's had no hypoxia here however. Patient's been asymptomatic other than a mild shortness of breath since arrival here. Suspect that dust under the house may have brought on the attack. But no significant findings here patient is stable and can be discharged home. Patient knows to follow-up his primary care doctor in the future about the pulmonary nodule found on the CT scan.  In addition no  evidence of any acute cardiac event. Troponins 2 were negative. EKG without acute changes. Patient never had any chest pain.  I personally performed the services described in this documentation, which was scribed in my presence. The recorded information has been reviewed and is accurate.  Fredia Sorrow, MD 01/06/15 1626

## 2015-01-06 NOTE — ED Notes (Signed)
Resp paged for breathing treatment.  

## 2015-01-06 NOTE — ED Notes (Signed)
Patient brought in via EMS from home. Alert and oriented. Airway patent. Patient c/o shortness of breath after crawling under house to check a pipe. Denies any chest pain. Patient sinus tach on monitor with O2 sat 98-100% on room air per EMS personal. Patient placed on 2 liters of oxygen via [place] by EMS for comfort. Denies any cardiac hx or COPD but states he has been checked before by cardiologist due to shortness of breath in past.

## 2015-06-25 ENCOUNTER — Other Ambulatory Visit: Payer: Self-pay | Admitting: Family Medicine

## 2015-06-26 ENCOUNTER — Other Ambulatory Visit: Payer: Self-pay | Admitting: *Deleted

## 2015-06-26 DIAGNOSIS — I10 Essential (primary) hypertension: Secondary | ICD-10-CM

## 2015-06-26 MED ORDER — FUROSEMIDE 20 MG PO TABS: ORAL_TABLET | ORAL | Status: DC

## 2015-06-26 MED ORDER — LISINOPRIL-HYDROCHLOROTHIAZIDE 10-12.5 MG PO TABS: 1.0000 | ORAL_TABLET | Freq: Every day | ORAL | Status: DC

## 2015-06-28 ENCOUNTER — Telehealth: Payer: Self-pay | Admitting: Family Medicine

## 2015-06-28 DIAGNOSIS — I10 Essential (primary) hypertension: Secondary | ICD-10-CM

## 2015-06-28 MED ORDER — FUROSEMIDE 20 MG PO TABS: ORAL_TABLET | ORAL | Status: DC

## 2015-06-28 MED ORDER — LISINOPRIL-HYDROCHLOROTHIAZIDE 10-12.5 MG PO TABS: 1.0000 | ORAL_TABLET | Freq: Every day | ORAL | Status: DC

## 2015-06-28 NOTE — Telephone Encounter (Signed)
Pt needs a refill on his Lisinopril and Furosemide to last him until his physical; soonest available is 11/26/15. Pt only has 30 days left. Pt uses the [facility] in [place]. Pt was notified that someone would be in touch if he needed to be seen sooner to get a refill on his rx. Please advise.

## 2015-06-28 NOTE — Telephone Encounter (Signed)
Rx sent.  Patient must come for his visit 11/26/15 for more refills.

## 2015-11-19 ENCOUNTER — Other Ambulatory Visit (INDEPENDENT_AMBULATORY_CARE_PROVIDER_SITE_OTHER): Payer: 59

## 2015-11-19 DIAGNOSIS — R7989 Other specified abnormal findings of blood chemistry: Secondary | ICD-10-CM | POA: Diagnosis not present

## 2015-11-19 DIAGNOSIS — Z0000 Encounter for general adult medical examination without abnormal findings: Secondary | ICD-10-CM | POA: Diagnosis not present

## 2015-11-19 LAB — BASIC METABOLIC PANEL
BUN: 16 mg/dL (ref 6–23)
CO2: 30 mEq/L (ref 19–32)
Calcium: 9.4 mg/dL (ref 8.4–10.5)
Chloride: 104 mEq/L (ref 96–112)
Creatinine, Ser: 1.01 mg/dL (ref 0.40–1.50)
GFR: 81.67 mL/min (ref >60.00)
GLUCOSE: 110 mg/dL — AB (ref 70–99)
Potassium: 4.5 mEq/L (ref 3.5–5.1)
SODIUM: 143 meq/L (ref 135–145)

## 2015-11-19 LAB — CBC WITH DIFFERENTIAL/PLATELET
BASOS ABS: 0 10*3/uL (ref 0.0–0.1)
Basophils Relative: 0.4 % (ref 0.0–3.0)
Eosinophils Absolute: 0.2 10*3/uL (ref 0.0–0.7)
Eosinophils Relative: 2.1 % (ref 0.0–5.0)
HCT: 44.2 % (ref 39.0–52.0)
Hemoglobin: 14.6 g/dL (ref 13.0–17.0)
LYMPHS ABS: 2 10*3/uL (ref 0.7–4.0)
Lymphocytes Relative: 22.8 % (ref 12.0–46.0)
MCHC: 33.2 g/dL (ref 30.0–36.0)
MCV: 84.1 fl (ref 78.0–100.0)
MONOS PCT: 6 % (ref 3.0–12.0)
Monocytes Absolute: 0.5 10*3/uL (ref 0.1–1.0)
NEUTROS PCT: 68.7 % (ref 43.0–77.0)
Neutro Abs: 6 10*3/uL (ref 1.4–7.7)
Platelets: 242 10*3/uL (ref 150.0–400.0)
RBC: 5.25 Mil/uL (ref 4.22–5.81)
RDW: 13.9 % (ref 11.5–15.5)
WBC: 8.8 10*3/uL (ref 4.0–10.5)

## 2015-11-19 LAB — LIPID PANEL
Cholesterol: 164 mg/dL (ref 0–200)
HDL: 32.4 mg/dL — ABNORMAL LOW (ref >39.00)
NonHDL: 131.72
Total CHOL/HDL Ratio: 5
Triglycerides: 233 mg/dL — ABNORMAL HIGH (ref 0.0–149.0)
VLDL: 46.6 mg/dL — AB (ref 0.0–40.0)

## 2015-11-19 LAB — POCT URINALYSIS DIPSTICK
Bilirubin, UA: NEGATIVE
Blood, UA: NEGATIVE
Glucose, UA: NEGATIVE
KETONES UA: NEGATIVE
LEUKOCYTES UA: NEGATIVE
Nitrite, UA: NEGATIVE
PH UA: 7
PROTEIN UA: NEGATIVE
SPEC GRAV UA: 1.02
UROBILINOGEN UA: 0.2

## 2015-11-19 LAB — LDL CHOLESTEROL, DIRECT: Direct LDL: 102 mg/dL

## 2015-11-19 LAB — PSA: PSA: 0.39 ng/mL (ref 0.10–4.00)

## 2015-11-19 LAB — HEPATIC FUNCTION PANEL
ALK PHOS: 58 U/L (ref 39–117)
ALT: 17 U/L (ref 0–53)
AST: 13 U/L (ref 0–37)
Albumin: 4.2 g/dL (ref 3.5–5.2)
Bilirubin, Direct: 0.1 mg/dL (ref 0.0–0.3)
Total Bilirubin: 0.4 mg/dL (ref 0.2–1.2)
Total Protein: 6.8 g/dL (ref 6.0–8.3)

## 2015-11-19 LAB — TSH: TSH: 2.35 u[IU]/mL (ref 0.35–4.50)

## 2015-11-26 ENCOUNTER — Telehealth: Payer: Self-pay | Admitting: Family Medicine

## 2015-11-26 ENCOUNTER — Ambulatory Visit (INDEPENDENT_AMBULATORY_CARE_PROVIDER_SITE_OTHER): Payer: 59 | Admitting: Family Medicine

## 2015-11-26 ENCOUNTER — Encounter: Payer: Self-pay | Admitting: Family Medicine

## 2015-11-26 VITALS — BP 110/68 | Temp 98.2°F | Ht 70.5 in | Wt 286.0 lb

## 2015-11-26 DIAGNOSIS — R7309 Other abnormal glucose: Secondary | ICD-10-CM

## 2015-11-26 DIAGNOSIS — E669 Obesity, unspecified: Secondary | ICD-10-CM

## 2015-11-26 DIAGNOSIS — G8929 Other chronic pain: Secondary | ICD-10-CM

## 2015-11-26 DIAGNOSIS — M545 Low back pain: Secondary | ICD-10-CM

## 2015-11-26 DIAGNOSIS — I10 Essential (primary) hypertension: Secondary | ICD-10-CM | POA: Diagnosis not present

## 2015-11-26 DIAGNOSIS — G4733 Obstructive sleep apnea (adult) (pediatric): Secondary | ICD-10-CM | POA: Diagnosis not present

## 2015-11-26 DIAGNOSIS — R609 Edema, unspecified: Secondary | ICD-10-CM | POA: Diagnosis not present

## 2015-11-26 DIAGNOSIS — Z0000 Encounter for general adult medical examination without abnormal findings: Secondary | ICD-10-CM

## 2015-11-26 DIAGNOSIS — Z23 Encounter for immunization: Secondary | ICD-10-CM

## 2015-11-26 DIAGNOSIS — N529 Male erectile dysfunction, unspecified: Secondary | ICD-10-CM

## 2015-11-26 DIAGNOSIS — N528 Other male erectile dysfunction: Secondary | ICD-10-CM

## 2015-11-26 DIAGNOSIS — R739 Hyperglycemia, unspecified: Secondary | ICD-10-CM

## 2015-11-26 LAB — HEMOGLOBIN A1C: Hgb A1c MFr Bld: 5.6 % (ref 4.6–6.5)

## 2015-11-26 MED ORDER — SILDENAFIL CITRATE 20 MG PO TABS: 20.0000 mg | ORAL_TABLET | Freq: Three times a day (TID) | ORAL | Status: DC

## 2015-11-26 MED ORDER — LISINOPRIL-HYDROCHLOROTHIAZIDE 10-12.5 MG PO TABS: 1.0000 | ORAL_TABLET | Freq: Every day | ORAL | Status: DC

## 2015-11-26 MED ORDER — FUROSEMIDE 20 MG PO TABS: ORAL_TABLET | ORAL | Status: DC

## 2015-11-26 NOTE — Patient Instructions (Addendum)
Continue current medications  Walk 30 minutes daily  Complete sugar free diet  We'll get you set up to see the nutritionist sick,  Follow-up in 3 months......... nonfasting labs one week prior........... this would be a good opportunity to switch to Dr. Gerri Lins also call GI to get you set up to meet them to discuss a screening colonoscopy

## 2015-11-26 NOTE — Progress Notes (Signed)
Pre visit review using our clinic review tool, if applicable. No additional management support is needed unless otherwise documented below in the visit note. 

## 2015-11-26 NOTE — Telephone Encounter (Signed)
Pt is asking if you will except him as a pt. He said his whole family see you. He is a pt of Dr Sherren Mocha

## 2015-11-26 NOTE — Telephone Encounter (Signed)
I don't think I have seen him - if he is looking to transfer care I would let him know I am full and recommend cory or Dr. Yong Channel. If he insist - can schedule NPV in available 30 minute slot only.

## 2015-11-26 NOTE — Progress Notes (Signed)
Subjective:    Patient ID: Kenneth Berry, male    DOB: [birth year], 54 y.o.   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  He's been to see Dr. Carloyn Manner neurosurgery who recommended no surgery at this juncture however he still having a lot of pain. He's tried Flexeril pain medication including Vicodin and tramadol nothing helps. His weight is up from 270 pounds 286 pounds. Now his blood sugars elevated 110.  Blood pressure on Zestoretic 10-12 0.5 is 110/68  He also takes Lasix 20 mg because of peripheral edema. He also uses generic Viagra 20 mg when necessary for ED  Father's history of diabetes  He gets routine eye care, dental care, never had a colonoscopy. We put in request before. We'll try again.  He had an evaluation to hospital for shortness of breath. He states he was working under the house he was dusty he had some wheezing and shortness of breath and went to the hospital. The did numerous diagnostic studies into the a cardiogram and a CT of his chest all of which were normal.  Vaccinations up-to-date except he needs a flu shot   Review of Systems  Constitutional: Negative.   HENT: Negative.   Eyes: Negative.   Respiratory: Negative.   Cardiovascular: Negative.   Gastrointestinal: Negative.   Endocrine: Negative.   Genitourinary: Negative.   Musculoskeletal: Negative.   Skin: Negative.   Allergic/Immunologic: Negative.   Neurological: Negative.   Hematological: Negative.   Psychiatric/Behavioral: Negative.        Objective:   Physical Exam  Constitutional: He is oriented to person, place, and time. He appears well-developed and well-nourished.  HENT:  Head: Normocephalic and atraumatic.  Right Ear: External ear normal.  Left Ear: External ear normal.  Nose: Nose normal.  Mouth/Throat:  Oropharynx is clear and moist.  Eyes: Conjunctivae and EOM are normal. Pupils are equal, round, and reactive to light.  Neck: Normal range of motion. Neck supple. No JVD present. No tracheal deviation present. No thyromegaly present.  Cardiovascular: Normal rate, regular rhythm, normal heart sounds and intact distal pulses.  Exam reveals no gallop and no friction rub.   No murmur heard. Pulmonary/Chest: Effort normal and breath sounds normal. No stridor. No respiratory distress. He has no wheezes. He has no rales. He exhibits no tenderness.  Abdominal: Soft. Bowel sounds are normal. He exhibits no distension and no mass. There is no tenderness. There is no rebound and no guarding.  Genitourinary: Rectum normal, prostate normal and penis normal. Guaiac negative stool. No penile tenderness.  Musculoskeletal: Normal range of motion. He exhibits no edema or tenderness.  Lymphadenopathy:    He has no cervical adenopathy.  Neurological: He is alert and oriented to person, place, and time. He has normal reflexes. No cranial nerve deficit. He exhibits normal muscle tone.  Skin: Skin is warm and dry. No rash noted. No erythema. No pallor.  Psychiatric: He has a normal mood and affect. His behavior is normal. Judgment and thought content normal.  Nursing note and vitals reviewed.         Assessment & Plan:  Hypertension at goal....... continue current therapy  Obesity with elevated blood sugar........Marland Kitchen 110 fasting.......Marland Kitchen A1c nutrition consult it cone........ follow-up blood sugar and A1c in 3 months  Venous insufficiency..........Marland Kitchen weight loss  Obesity...Marland KitchenMarland KitchenMarland Kitchen weight loss  Chronic  low back pain.......Marland Kitchen reconsult with neurosurgery.

## 2015-11-27 ENCOUNTER — Encounter: Payer: Self-pay | Admitting: Gastroenterology

## 2015-11-27 NOTE — Telephone Encounter (Signed)
Pt scheduled with Dr Hunter  °

## 2016-01-02 ENCOUNTER — Ambulatory Visit (AMBULATORY_SURGERY_CENTER): Payer: Self-pay | Admitting: *Deleted

## 2016-01-02 VITALS — Ht 70.0 in | Wt 278.4 lb

## 2016-01-02 DIAGNOSIS — Z1211 Encounter for screening for malignant neoplasm of colon: Secondary | ICD-10-CM

## 2016-01-02 NOTE — Progress Notes (Signed)
No egg or soy allergy known to patient  No issues with past sedation with any surgeries  or procedures, no intubation problems  No diet pills per patient No home 02 use per patient  No blood thinners per patient    

## 2016-01-16 ENCOUNTER — Encounter: Payer: Self-pay | Admitting: Gastroenterology

## 2016-01-16 ENCOUNTER — Ambulatory Visit (AMBULATORY_SURGERY_CENTER): Payer: BLUE CROSS/BLUE SHIELD | Admitting: Gastroenterology

## 2016-01-16 VITALS — BP 96/42 | HR 73 | Temp 98.1°F | Resp 16 | Ht 70.0 in | Wt 278.0 lb

## 2016-01-16 DIAGNOSIS — D128 Benign neoplasm of rectum: Secondary | ICD-10-CM

## 2016-01-16 DIAGNOSIS — Z1211 Encounter for screening for malignant neoplasm of colon: Secondary | ICD-10-CM

## 2016-01-16 DIAGNOSIS — D127 Benign neoplasm of rectosigmoid junction: Secondary | ICD-10-CM

## 2016-01-16 DIAGNOSIS — D123 Benign neoplasm of transverse colon: Secondary | ICD-10-CM

## 2016-01-16 DIAGNOSIS — D121 Benign neoplasm of appendix: Secondary | ICD-10-CM | POA: Diagnosis not present

## 2016-01-16 DIAGNOSIS — D129 Benign neoplasm of anus and anal canal: Secondary | ICD-10-CM

## 2016-01-16 MED ORDER — SODIUM CHLORIDE 0.9 % IV SOLN: 500.0000 mL | INTRAVENOUS | Status: DC

## 2016-01-16 NOTE — Patient Instructions (Signed)
YOU HAD AN ENDOSCOPIC PROCEDURE TODAY AT [facility]:   Refer to the procedure report that was given to you for any specific questions about what was found during the examination.  If the procedure report does not answer your questions, please call your gastroenterologist to clarify.  If you requested that your care partner not be given the details of your procedure findings, then the procedure report has been included in a sealed envelope for you to review at your convenience later.  YOU SHOULD EXPECT: Some feelings of bloating in the abdomen. Passage of more gas than usual.  Walking can help get rid of the air that was put into your GI tract during the procedure and reduce the bloating. If you had a lower endoscopy (such as a colonoscopy or flexible sigmoidoscopy) you may notice spotting of blood in your stool or on the toilet paper. If you underwent a bowel prep for your procedure, you may not have a normal bowel movement for a few days.  Please Note:  You might notice some irritation and congestion in your nose or some drainage.  This is from the oxygen used during your procedure.  There is no need for concern and it should clear up in a day or so.  SYMPTOMS TO REPORT IMMEDIATELY:   Following lower endoscopy (colonoscopy or flexible sigmoidoscopy):  Excessive amounts of blood in the stool  Significant tenderness or worsening of abdominal pains  Swelling of the abdomen that is new, acute  Fever of 100F or higher  For urgent or emergent issues, a gastroenterologist can be reached at any hour by calling [phone].   DIET: Your first meal following the procedure should be a small meal and then it is ok to progress to your normal diet. Heavy or fried foods are harder to digest and may make you feel nauseous or bloated.  Likewise, meals heavy in dairy and vegetables can increase bloating.  Drink plenty of fluids but you should avoid alcoholic beverages for 24  hours.  ACTIVITY:  You should plan to take it easy for the rest of today and you should NOT DRIVE or use heavy machinery until tomorrow (because of the sedation medicines used during the test).    FOLLOW UP: Our staff will call the number listed on your records the next business day following your procedure to check on you and address any questions or concerns that you may have regarding the information given to you following your procedure. If we do not reach you, we will leave a message.  However, if you are feeling well and you are not experiencing any problems, there is no need to return our call.  We will assume that you have returned to your regular daily activities without incident.  If any biopsies were taken you will be contacted by phone or by letter within the next 1-3 weeks.  Please call us at [phone] if you have not heard about the biopsies in 3 weeks.    SIGNATURES/CONFIDENTIALITY: You and/or your care partner have signed paperwork which will be entered into your electronic medical record.  These signatures attest to the fact that that the information above on your After Visit Summary has been reviewed and is understood.  Full responsibility of the confidentiality of this discharge information lies with you and/or your care-partner.  No NSAIDS (Ibuprofen, Advil, Aleve, Motrin) for 2 weeks; Tylenol is ok  Resume diet and medications  Please start taking over the counter fiber  supplement  Please read over handouts about polyps, diverticulosis, high fiber diets, hemorrhoids and hemorrhoidal banding

## 2016-01-16 NOTE — Progress Notes (Signed)
Called to room to assist during endoscopic procedure.  Patient ID and intended procedure confirmed with present staff. Received instructions for my participation in the procedure from the performing physician.  

## 2016-01-16 NOTE — Op Note (Signed)
[place]  [address] [place], [address]   COLONOSCOPY PROCEDURE REPORT  PATIENT: Kenneth, Berry  MR#: [id number] BIRTHDATE: 24-Sep-1961 , 26  yrs. old GENDER: male ENDOSCOPIST: Yetta Flock, MD REFERRED BY: Stevie Kern MD PROCEDURE DATE:  01/16/2016 PROCEDURE:   Colonoscopy, screening, Colonoscopy with snare polypectomy, and Colonoscopy with biopsy First Screening Colonoscopy - Avg.  risk and is 50 yrs.  old or older Yes.  Prior Negative Screening - Now for repeat screening. N/A  History of Adenoma - Now for follow-up colonoscopy & has been > or = to 3 yrs.  N/A  Polyps removed today? Yes ASA CLASS:   Class III INDICATIONS:Screening for colonic neoplasia and Colorectal Neoplasm Risk Assessment for this procedure is average risk. MEDICATIONS: Propofol 300 mg IV  DESCRIPTION OF PROCEDURE:   After the risks benefits and alternatives of the procedure were thoroughly explained, informed consent was obtained.  The digital rectal exam revealed external hemorrhoids.   The LB CF-H180AL Loaner [id number]  endoscope was introduced through the anus and advanced to the cecum, which was identified by both the appendix and ileocecal valve. No adverse events experienced.   The quality of the prep was adequate  The instrument was then slowly withdrawn as the colon was fully examined. Estimated blood loss is zero unless otherwise noted in this procedure report.   COLON FINDINGS: Appendiceal orifice was remarkable for a suspected polyp without clear borders.  It appeared to be invading the appendiceal orifice and multiple biopsies were obtained.  A 48mm sessile polyp in the transverse colon was removed with cold forceps.  A 32mm sessile polyp was noted in the rectosigmoid colon and removed via cold snare.  A 5mm sessile polyp in the rectum was noted and removed with cold forceps.  Mild diverticulosis was noted in the sigmoid colon.  Retroflexed views revealed  internal hemorrhoids. The time to cecum = 3.1 Withdrawal time = 16.4   The scope was withdrawn and the procedure completed. COMPLICATIONS: There were no immediate complications.  ENDOSCOPIC IMPRESSION: Suspected polyp invading the appendiceal orifice, biopsies obtained, I could not see clear borders to remove it in entirety 3 colon polyps removed as outlined above Mild diverticulosis Internal hemorrhoids   RECOMMENDATIONS: Await pathology results Resume diet Resume medications No NSAIDS for 2 weeks Daily fiber supplement Consider hemorrhoid banding if hemorrhoids are causing symptoms  Pending pathology results, surgical consult may be needed for consideration of appendectomy  eSigned:  Yetta Flock, MD 01/16/2016 12:23 PM   cc: Stevie Kern MD, the patient   PATIENT NAME:  Kenneth, Berry MR#: [id number]

## 2016-01-16 NOTE — Progress Notes (Signed)
Report to PACU, RN, vss, BBS= Clear.  

## 2016-01-17 ENCOUNTER — Telehealth: Payer: Self-pay

## 2016-01-17 NOTE — Telephone Encounter (Signed)
  Follow up Call-  Call back number 01/16/2016  Post procedure Call Back phone  # [phone]  Permission to leave phone message Yes     Patient questions:  Do you have a fever, pain , or abdominal swelling? No. Pain Score  0 *  Have you tolerated food without any problems? Yes.    Have you been able to return to your normal activities? Yes.    Do you have any questions about your discharge instructions: Diet   No. Medications  No. Follow up visit  No.  Do you have questions or concerns about your Care? No.  Actions: * If pain score is 4 or above: No action needed, pain <4.

## 2016-02-18 ENCOUNTER — Ambulatory Visit (INDEPENDENT_AMBULATORY_CARE_PROVIDER_SITE_OTHER): Payer: BLUE CROSS/BLUE SHIELD | Admitting: Family Medicine

## 2016-02-18 VITALS — BP 90/60 | Temp 98.5°F | Wt 279.0 lb

## 2016-02-18 DIAGNOSIS — R739 Hyperglycemia, unspecified: Secondary | ICD-10-CM | POA: Diagnosis not present

## 2016-02-18 DIAGNOSIS — I10 Essential (primary) hypertension: Secondary | ICD-10-CM

## 2016-02-18 DIAGNOSIS — N528 Other male erectile dysfunction: Secondary | ICD-10-CM

## 2016-02-18 DIAGNOSIS — N529 Male erectile dysfunction, unspecified: Secondary | ICD-10-CM

## 2016-02-18 DIAGNOSIS — R7309 Other abnormal glucose: Secondary | ICD-10-CM | POA: Diagnosis not present

## 2016-02-18 LAB — POCT GLYCOSYLATED HEMOGLOBIN (HGB A1C): Hemoglobin A1C: 5.7

## 2016-02-18 MED ORDER — LISINOPRIL 5 MG PO TABS: 5.0000 mg | ORAL_TABLET | Freq: Every day | ORAL | Status: DC

## 2016-02-18 MED ORDER — SILDENAFIL CITRATE 20 MG PO TABS: 20.0000 mg | ORAL_TABLET | Freq: Three times a day (TID) | ORAL | Status: DC

## 2016-02-18 NOTE — Progress Notes (Signed)
Pre visit review using our clinic review tool, if applicable. No additional management support is needed unless otherwise documented below in the visit note. 

## 2016-02-18 NOTE — Progress Notes (Signed)
   Subjective:    Patient ID: Kenneth Berry, male    DOB: [birth year], 55 y.o.   MRN: [id number]  HPI Kenneth Berry is a 55 year old married male nonsmoker who comes in today for follow-up of hypertension diabetes He states he went off sodas completely A1c is about the same 5.7%. Blood sugar in the 110 range. Weight was 278 in February now it's 279. Review of Systems Review of systems otherwise negative    Objective:   Physical Exam  Well-developed well-nourished male no acute distress vital signs stable he is afebrile except for BP low 90/60      Assessment & Plan:  Hypertension BP too low........ decrease lisinopril to 5 mg daily  Glucose intolerance............ again stressed diet exercise weight loss..... Follow-up A1c and office visit in September

## 2016-02-18 NOTE — Patient Instructions (Signed)
Decrease the lisinopril to 5 mg daily.......... skip tomorrow's dose.......... then restarted on Wednesday  Work hard on the diet exercise and weight loss........... follow-up in September.......... labs one week prior

## 2016-05-03 ENCOUNTER — Encounter (HOSPITAL_COMMUNITY): Payer: Self-pay

## 2016-05-03 ENCOUNTER — Emergency Department (HOSPITAL_COMMUNITY)
Admission: EM | Admit: 2016-05-03 | Discharge: 2016-05-03 | Disposition: A | Discharge status: Home / Self Care | Payer: BLUE CROSS/BLUE SHIELD | Attending: Emergency Medicine | Admitting: Emergency Medicine

## 2016-05-03 DIAGNOSIS — Z87891 Personal history of nicotine dependence: Secondary | ICD-10-CM | POA: Insufficient documentation

## 2016-05-03 DIAGNOSIS — M1990 Unspecified osteoarthritis, unspecified site: Secondary | ICD-10-CM | POA: Insufficient documentation

## 2016-05-03 DIAGNOSIS — Z79899 Other long term (current) drug therapy: Secondary | ICD-10-CM | POA: Insufficient documentation

## 2016-05-03 DIAGNOSIS — I10 Essential (primary) hypertension: Secondary | ICD-10-CM | POA: Insufficient documentation

## 2016-05-03 DIAGNOSIS — Z203 Contact with and (suspected) exposure to rabies: Secondary | ICD-10-CM | POA: Insufficient documentation

## 2016-05-03 DIAGNOSIS — Z23 Encounter for immunization: Secondary | ICD-10-CM | POA: Diagnosis not present

## 2016-05-03 MED ORDER — RABIES VIRUS VACCINE, HDC IM INJ
1.0000 mL | INJECTION | Freq: Once | INTRAMUSCULAR | Status: AC
  Administered 2016-05-03: 1 mL via INTRAMUSCULAR
  Filled 2016-05-03: qty 1

## 2016-05-03 MED ORDER — RABIES IMMUNE GLOBULIN 150 UNIT/ML IM INJ
20.0000 [IU]/kg | INJECTION | Freq: Once | INTRAMUSCULAR | Status: AC
  Administered 2016-05-03: 2550 [IU] via INTRAMUSCULAR
  Filled 2016-05-03: qty 17

## 2016-05-03 NOTE — ED Notes (Signed)
Patient had exposure to a cat with ? Rabies. Denies any bite or injury from cat. Reports of headache.

## 2016-05-03 NOTE — Discharge Instructions (Signed)
Rabies °Rabies is a viral infection that can be spread to people from infected animals. The infection affects the brain and central nervous system. Once the disease develops, it almost always causes death. Because of this, when a person is bitten by an animal that may have rabies, treatment to prevent rabies often needs to be started whether or not the animal is known to be infected. Prompt treatment with the rabies vaccine and rabies immune globulin is very effective at preventing the infection from developing in people who have been exposed to the rabies virus. °CAUSES  °Rabies is caused by a virus that lives inside some animals. When a person is bitten by an infected animal, the rabies virus is spread to the person through the infected spit (saliva) of the animal. This virus can be carried by animals such as dogs, cats, skunks, bats, woodchucks, raccoons, coyotes, and foxes. °SYMPTOMS  °By the time symptoms appear, rabies is usually fatal for the person. Common symptoms include: °· Headache. °· Fever. °· Fatigue and weakness. °· Agitation. °· Anxiety. °· Confusion. °· Unusual behavior, such as hyperactivity, fear of water (hydrophobia), or fear of air (aerophobia). °· Hallucinations. °· Insomnia. °· Weakness in the arms or legs. °· Difficulty swallowing. °Most people get sick in 1-3 months after being bitten. This often varies and may depend on the location of the bite. The infection will take less time to develop if the bite occurred closer to the head.  °DIAGNOSIS  °To determine if a person is infected, several tests must be performed, such as: °· A skin biopsy. °· A saliva test. °· A lumbar puncture to remove spinal fluid so it can be examined. °· Blood tests. °TREATMENT  °Treatment to prevent the infection from developing (post-exposure prophylaxis, PEP) is often started before knowing for sure if the person has been exposed to the rabies virus. PEP involves cleaning the wound, giving an antibody injection  (rabies immune globulin), and giving a series of rabies vaccine injections. The series of injections are usually given over a two-week period. If possible, the animal that bit the person will be observed to see if it remains healthy. If the animal has been killed, it can be sent to a state laboratory and examined to see if the animal had rabies. °If a person is bitten by a domestic animal (dog, cat, or ferret) that appears healthy and can be observed to see if it remains healthy, often no further treatment is necessary other than care of the wounds caused by the animal. °Rabies is often a fatal illness once the infection develops in a person. Although a few people who developed rabies have survived after experimental treatment with certain drugs, all these survivors still had severe nervous system problems after the treatment. This is why caregivers use extra caution and begin PEP treatment for people who have been bitten by animals that are possibly infected with rabies.  °HOME CARE INSTRUCTIONS  °If you were bitten by an unknown animal, make sure you know your caregiver's instructions for follow-up. If the animal was sent to a laboratory for examination, ask when the test results will be ready. Make sure you get the test results.  °Take these steps to care for your wound: °· Keep the wound clean, dry, and dressed as directed by your caregiver. °· Keep the injured part elevated as much as possible. °· Do not resume use of the affected area until directed. °· Only take over-the-counter or prescription medicines as directed by your   caregiver.  Keep all follow-up appointments as directed by your caregiver. PREVENTION  To prevent rabies, people need to reduce their risk of having contact with infected animals.   Make sure your pets (dogs, cats, ferrets) are vaccinated against rabies. Keep these vaccinations up-to-date as directed by your veterinarian.  Supervise your pets when they are outside. Keep them away  from wild animals.  Call your local animal control services to report any stray animals. These animals may not be vaccinated.  Stay away from stray or wild animals.  Consider getting the rabies vaccine (preexposure) if you are traveling to an area where rabies is common or if your job or activities involve possible contact with wild or stray animals. Discuss this with your caregiver.   Rabies Vaccine: What You Need to Know WHAT IS RABIES?  Rabies is a serious disease. It is caused by a virus.  Rabies is mainly a disease of animals. Humans get rabies when they are bitten by infected animals.  At first there might not be any symptoms. But weeks, or even years after a bite, rabies can cause pain, fatigue, headaches, fever, and irritability. These are followed by seizures, hallucinations, and paralysis. Human rabies is almost always fatal.  Wild animals, especially bats, are the most common source of human rabies infection in the [place]. Skunks, raccoons, dogs, cats, coyotes, foxes, and other mammals can also transmit the disease.  Human rabies is rare in the [place]. There have been only 51 cases diagnosed since 1990. However, between 16,000 and 39,000 people are vaccinated each year as a precaution after animal bites. Also, rabies is far more common in other parts of the world, with about 40,000 to 70,000 rabies-related deaths worldwide each year. Bites from unvaccinated dogs cause most of these cases. Rabies vaccine can prevent rabies. RABIES VACCINE  Rabies vaccine is given to people at high risk of rabies to protect them if they are exposed. It can also prevent the disease if it is given to a person after they have been exposed.  Rabies vaccine is made from killed rabies virus. It cannot cause rabies. WHO SHOULD GET RABIES VACCINE AND WHEN? Preventive Vaccination (No Exposure)  People at high risk of exposure to rabies, such as veterinarians, Insurance account manager, rabies  laboratory workers, spelunkers, and rabies biologics production workers should be offered rabies vaccine.  The vaccine should also be considered for:  People whose activities bring them into frequent contact with rabies virus or with possibly rabid animals.  International travelers who are likely to come in contact with animals in parts of the world where rabies is common.  The pre-exposure schedule for rabies vaccination is 3 doses, given at the following times:  Dose 1: As appropriate.  Dose 2: 7 days after Dose 1.  Dose 3: 21 days or 28 days after Dose 1.  For laboratory workers and others who may be repeatedly exposed to rabies virus, periodic testing for immunity is recommended and booster doses should be given as needed. (Testing or booster doses are not recommended for travelers). Ask your doctor for details. Vaccination After an Exposure Anyone who has been bitten by an animal, or who otherwise may have been exposed to rabies, should clean the wound and see a doctor immediately. The doctor will determine if they need to be vaccinated. A person who is exposed and has never been vaccinated against rabies should get 4 doses of rabies vaccine: one dose right away and additional doses on the 3rd,  7th, and 14th days. They should also get another shot called Rabies Immune Globulin at the same time as the first dose.  A person who has been previously vaccinated should get 2 doses of rabies vaccine: one right away and another on the 3rd day. Rabies Immune Globulin is not needed. TELL YOUR DOCTOR IF: Talk with a doctor before getting rabies vaccine if you:  Ever had a serious (life-threatening) allergic reaction to a previous dose of rabies vaccine or to any component of the vaccine; tell your doctor if you have any severe allergies.  Have a weakened immune system because of:  HIV, AIDS, or another disease that affects the immune system.  Treatment with drugs that affect the immune  system, such as steroids.  Cancer or cancer treatment with radiation or drugs. If you have a minor illness, such as a cold, you can be vaccinated. If you are moderately or severely ill, you should probably wait until you recover before getting a routine (non-exposure) dose of rabies vaccine. If you have been exposed to rabies virus, you should get the vaccine regardless of any other illnesses you may have. WHAT ARE THE RISKS FROM RABIES VACCINE? A vaccine, like any medicine, is capable of causing serious problems, such as severe allergic reactions. The risk of a vaccine causing serious harm, or death, is extremely small. Serious problems from rabies vaccine are very rare.  Mild problems:  Soreness, redness, swelling, or itching where the shot was given (30% to 74%).  Headache, nausea, abdominal pain, muscle aches, or dizziness (5% to 40%). Moderate problems:  Hives, pain in the joints, or fever (about 6% of booster doses).  Other nervous system disorders, such as Guillain-Barr Syndrome (GBS), have been reported after rabies vaccine, but this happens so rarely that it is not known whether they are related to the vaccine. Note: Several brands of rabies vaccine are available in the [place], and reactions may vary between brands. Your provider can give you more information about a particular brand. WHAT IF THERE IS A SERIOUS REACTION? What should I look for? Look for anything that concerns you, such as signs of a severe allergic reaction, very high fever, or behavior changes.  Signs of a severe allergic reaction can include hives, swelling of the face and throat, difficulty breathing, a fast heartbeat, dizziness, and weakness. These would start a few minutes to a few hours after the vaccination. What should I do?  If you think it is a severe allergic reaction or other emergency that cannot wait, call 911 or get the person to the nearest hospital. Otherwise, call your doctor.  Afterward,  the reaction should be reported to the Vaccine Adverse Event Reporting System (VAERS). Your doctor might file this report, or you can do it yourself through the VAERS website at www.vaers.LAgents.nohhs.gov or by calling 1-862-020-6327. VAERS is only for reporting reactions. They do not give medical advice. HOW CAN I LEARN MORE?  Ask your doctor.  Call your local or state health department.  Contact the Centers for Disease Control and Prevention (CDC):  Visit the CDC rabies website at wwwcrv.comwww.cdc.gov/rabies/ CDC Rabies Vaccine VIS (09/12/08)   This information is not intended to replace advice given to you by your health care provider. Make sure you discuss any questions you have with your health care provider.   Document Released: 09/21/2006 Document Revised: 04/10/2015 Document Reviewed: 03/16/2013 Elsevier Interactive Patient Education [employer]2016 Elsevier Inc.  This information is not intended to replace advice given to you  by your health care provider. Make sure you discuss any questions you have with your health care provider.   Document Released: 11/24/2005 Document Revised: 12/15/2014 Document Reviewed: 06/22/2012 Elsevier Interactive Patient Education [employer].

## 2016-05-03 NOTE — ED Provider Notes (Signed)
CSN: [id number]     Arrival date & time 05/03/16  1427 History   First MD Initiated Contact with Patient 05/03/16 1455     Chief Complaint  Patient presents with  . Rabies Injection     (Consider location/radiation/quality/duration/timing/severity/associated sxs/prior Treatment) The history is provided by the patient.    Kenneth Berry is a 55 y.o. male  presenting for concern about possible rabies exposure.  He presents with his wife and children for evaluation since one of their unvaccinated barn cats became ill 3 days ago and died yesterday morning in the custody of animal control.  Father describes the cat was constantly meowing, acted confused, was staggering on Wednesday and by the next morning was also drooling at the mouth.  The animal will not be tested for rabies as it was not associated with a bite and has been disposed of.   His daughter had significant contact as when she first recognized he was acting sick, she picked him up and brought him indoors into her bedroom.  The cat stayed in her arms for about 10 minutes before mother made her take it back to the barn. While she was holding the cat, Kenneth Berry was also petting it. The cat kneaded his and her arms and shoulder while being held but he does not believe he was pricked or scratched by the cats claws.   Both he and the daughter have developed mild headaches today and he is concerned about possible early sx of rabies as he has been researching sx online.        Past Medical History  Diagnosis Date  . Hypertension   . Sleep apnea     wears cpap  . Allergy     seasonal spring the worst   . Arthritis     spine,neck  . Hemorrhoid     occasionally bleeds    Past Surgical History  Procedure Laterality Date  . Neck fusion    . Tonsillectomy and adenoidectomy    . Uvulopalatopharyngoplasty    . 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 Cancer Maternal  Grandfather     lung  . Arrhythmia Father     atrial flutter  . Colon cancer Neg Hx   . Colon polyps Neg Hx   . Esophageal cancer Neg Hx   . Rectal cancer Neg Hx   . Stomach cancer Neg Hx   . Heart failure Father   . Parkinson's disease Father    Social History  Substance Use Topics  . Smoking status: Former Smoker -- 0.75 packs/day for 10 years    Types: Cigarettes    Quit date: 12/08/1988  . Smokeless tobacco: Former Systems developer  . Alcohol Use: No    Review of Systems  Constitutional: Negative for fever and chills.  Respiratory: Negative.   Cardiovascular: Negative.   Genitourinary: Negative.   Musculoskeletal: Negative.   Skin: Negative.  Negative for rash and wound.  Neurological: Positive for headaches. Negative for dizziness, weakness, light-headedness and numbness.  Psychiatric/Behavioral: Negative.       Allergies  Review of patient's allergies indicates no known allergies.  Home Medications   Prior to Admission medications   Medication Sig Start Date End Date Taking? Authorizing Provider  furosemide (LASIX) 20 MG tablet TAKE ONE TABLET BY MOUTH ONCE DAILY IN THE MORNING 11/26/15  Yes Dorena Cookey, MD  lisinopril (PRINIVIL,ZESTRIL) 5 MG tablet Take  1 tablet (5 mg total) by mouth daily. 02/18/16  Yes Dorena Cookey, MD  sildenafil (REVATIO) 20 MG tablet Take 1 tablet (20 mg total) by mouth 3 (three) times daily. 02/18/16  Yes Dorena Cookey, MD   BP 117/77 mmHg  Pulse 108  Temp(Src) 98.3 F (36.8 C) (Oral)  Resp 16  Ht 5\' 10"  (1.778 m)  Wt 127.007 kg  BMI 40.18 kg/m2  SpO2 95% Physical Exam  Constitutional: He appears well-developed and well-nourished.  HENT:  Head: Normocephalic and atraumatic.  Eyes: Conjunctivae are normal.  Neck: Normal range of motion.  Cardiovascular: Normal rate.   Pulmonary/Chest: Effort normal.  Musculoskeletal: Normal range of motion.  Neurological: He is alert.  Skin: Skin is warm and dry.  No rash, no skin lesions.   Psychiatric: He has a normal mood and affect.  Nursing note and vitals reviewed.   ED Course  Procedures (including critical care time) Labs Review Labs Reviewed - No data to display  Imaging Review No results found. I have personally reviewed and evaluated these images and lab results as part of my medical decision-making.   EKG Interpretation None      MDM   Final diagnoses:  Contact with and suspected exposure to rabies    Discussed case with Dr Rogene Houston, also call placed to poison control who confirms animals only tested for bite cases. Reassurance given that headache is not associated with potential rabies exposure as sx would not occur that quickly.  Father concerned his and dg's exposures were significant although not blood contact, more possibly saliva contact.  Will start on vaccine and immunoglobulin given.  Vaccine schedule given for remainder of vaccines.     Evalee Jefferson, PA-C 05/03/16 [place], MD 05/03/16 1736

## 2016-05-06 ENCOUNTER — Inpatient Hospital Stay (HOSPITAL_COMMUNITY): Admission: RE | Admit: 2016-05-06 | Payer: BLUE CROSS/BLUE SHIELD | Source: Ambulatory Visit

## 2016-05-06 ENCOUNTER — Encounter (HOSPITAL_COMMUNITY)
Admission: RE | Admit: 2016-05-06 | Discharge: 2016-05-06 | Disposition: A | Discharge status: Home / Self Care | Payer: BLUE CROSS/BLUE SHIELD | Source: Ambulatory Visit | Attending: Emergency Medicine | Admitting: Emergency Medicine

## 2016-05-06 DIAGNOSIS — Z203 Contact with and (suspected) exposure to rabies: Secondary | ICD-10-CM | POA: Insufficient documentation

## 2016-05-06 MED ORDER — RABIES VIRUS VACCINE, HDC IM INJ
INJECTION | INTRAMUSCULAR | Status: AC
  Filled 2016-05-06: qty 1

## 2016-05-06 MED ORDER — RABIES VIRUS VACCINE, HDC IM INJ
1.0000 mL | INJECTION | Freq: Once | INTRAMUSCULAR | Status: AC
  Administered 2016-05-06: 1 mL via INTRAMUSCULAR

## 2016-05-10 ENCOUNTER — Ambulatory Visit (HOSPITAL_COMMUNITY)
Admission: EM | Admit: 2016-05-10 | Discharge: 2016-05-10 | Disposition: A | Discharge status: Home / Self Care | Payer: BLUE CROSS/BLUE SHIELD | Attending: Emergency Medicine | Admitting: Emergency Medicine

## 2016-05-10 ENCOUNTER — Ambulatory Visit (HOSPITAL_COMMUNITY): Payer: BLUE CROSS/BLUE SHIELD

## 2016-05-10 ENCOUNTER — Encounter (HOSPITAL_COMMUNITY): Payer: Self-pay | Admitting: Emergency Medicine

## 2016-05-10 DIAGNOSIS — Z203 Contact with and (suspected) exposure to rabies: Secondary | ICD-10-CM | POA: Diagnosis not present

## 2016-05-10 MED ORDER — RABIES VIRUS VACCINE, HDC IM INJ
INJECTION | INTRAMUSCULAR | Status: AC
  Filled 2016-05-10: qty 1

## 2016-05-10 MED ORDER — RABIES VIRUS VACCINE, HDC IM INJ
1.0000 mL | INJECTION | Freq: Once | INTRAMUSCULAR | Status: AC
  Administered 2016-05-10: 1 mL via INTRAMUSCULAR

## 2016-05-10 NOTE — ED Notes (Addendum)
Here for 3rd rabies vaccination (day 7)... Pt rec'd 2nd shot in a private clinic in [place]; documentation provided.... Voices no new concerns.... A&O x4... No acute distress.

## 2016-05-10 NOTE — Discharge Instructions (Signed)
Return on 6/10 for 4th rabies vaccination

## 2016-05-17 ENCOUNTER — Encounter (HOSPITAL_COMMUNITY): Payer: Self-pay | Admitting: *Deleted

## 2016-05-17 ENCOUNTER — Ambulatory Visit (HOSPITAL_COMMUNITY)
Admission: EM | Admit: 2016-05-17 | Discharge: 2016-05-17 | Disposition: A | Discharge status: Home / Self Care | Payer: BLUE CROSS/BLUE SHIELD | Attending: Emergency Medicine | Admitting: Emergency Medicine

## 2016-05-17 ENCOUNTER — Ambulatory Visit (HOSPITAL_COMMUNITY): Payer: BLUE CROSS/BLUE SHIELD

## 2016-05-17 DIAGNOSIS — Z203 Contact with and (suspected) exposure to rabies: Secondary | ICD-10-CM | POA: Diagnosis not present

## 2016-05-17 MED ORDER — RABIES VACCINE, PCEC IM SUSR
1.0000 mL | Freq: Once | INTRAMUSCULAR | Status: AC
  Administered 2016-05-17: 1 mL via INTRAMUSCULAR

## 2016-05-17 MED ORDER — RABIES VACCINE, PCEC IM SUSR
INTRAMUSCULAR | Status: AC
  Filled 2016-05-17: qty 1

## 2016-05-17 NOTE — ED Notes (Signed)
Presents for rabies vaccine. 

## 2016-05-17 NOTE — Discharge Instructions (Signed)
Follow-up with your provider for any questions or concerns.

## 2016-08-13 ENCOUNTER — Other Ambulatory Visit (INDEPENDENT_AMBULATORY_CARE_PROVIDER_SITE_OTHER): Payer: BLUE CROSS/BLUE SHIELD

## 2016-08-13 DIAGNOSIS — R739 Hyperglycemia, unspecified: Secondary | ICD-10-CM | POA: Diagnosis not present

## 2016-08-13 DIAGNOSIS — Z0000 Encounter for general adult medical examination without abnormal findings: Secondary | ICD-10-CM

## 2016-08-13 DIAGNOSIS — R7989 Other specified abnormal findings of blood chemistry: Secondary | ICD-10-CM | POA: Diagnosis not present

## 2016-08-13 LAB — CBC WITH DIFFERENTIAL/PLATELET
BASOS PCT: 0.5 % (ref 0.0–3.0)
Basophils Absolute: 0 10*3/uL (ref 0.0–0.1)
EOS ABS: 0.2 10*3/uL (ref 0.0–0.7)
EOS PCT: 2 % (ref 0.0–5.0)
HEMATOCRIT: 42.3 % (ref 39.0–52.0)
Hemoglobin: 14.5 g/dL (ref 13.0–17.0)
LYMPHS PCT: 21.3 % (ref 12.0–46.0)
Lymphs Abs: 1.7 10*3/uL (ref 0.7–4.0)
MCHC: 34.3 g/dL (ref 30.0–36.0)
MCV: 83 fl (ref 78.0–100.0)
Monocytes Absolute: 0.6 10*3/uL (ref 0.1–1.0)
Monocytes Relative: 6.9 % (ref 3.0–12.0)
NEUTROS ABS: 5.7 10*3/uL (ref 1.4–7.7)
Neutrophils Relative %: 69.3 % (ref 43.0–77.0)
PLATELETS: 197 10*3/uL (ref 150.0–400.0)
RBC: 5.09 Mil/uL (ref 4.22–5.81)
RDW: 14.3 % (ref 11.5–15.5)
WBC: 8.2 10*3/uL (ref 4.0–10.5)

## 2016-08-13 LAB — POC URINALSYSI DIPSTICK (AUTOMATED)
Bilirubin, UA: NEGATIVE
Blood, UA: NEGATIVE
GLUCOSE UA: NEGATIVE
Ketones, UA: NEGATIVE
LEUKOCYTES UA: NEGATIVE
NITRITE UA: NEGATIVE
PROTEIN UA: NEGATIVE
Spec Grav, UA: 1.02
UROBILINOGEN UA: 0.2
pH, UA: 5.5

## 2016-08-13 LAB — LIPID PANEL
CHOLESTEROL: 156 mg/dL (ref 0–200)
HDL: 30.8 mg/dL — AB (ref >39.00)
NONHDL: 125.41
TRIGLYCERIDES: 254 mg/dL — AB (ref 0.0–149.0)
Total CHOL/HDL Ratio: 5
VLDL: 50.8 mg/dL — ABNORMAL HIGH (ref 0.0–40.0)

## 2016-08-13 LAB — HEPATIC FUNCTION PANEL
ALBUMIN: 4.4 g/dL (ref 3.5–5.2)
ALT: 19 U/L (ref 0–53)
AST: 14 U/L (ref 0–37)
Alkaline Phosphatase: 54 U/L (ref 39–117)
BILIRUBIN DIRECT: 0.1 mg/dL (ref 0.0–0.3)
TOTAL PROTEIN: 7 g/dL (ref 6.0–8.3)
Total Bilirubin: 0.4 mg/dL (ref 0.2–1.2)

## 2016-08-13 LAB — BASIC METABOLIC PANEL
BUN: 18 mg/dL (ref 6–23)
CHLORIDE: 104 meq/L (ref 96–112)
CO2: 29 meq/L (ref 19–32)
Calcium: 9.2 mg/dL (ref 8.4–10.5)
Creatinine, Ser: 1 mg/dL (ref 0.40–1.50)
GFR: 82.39 mL/min (ref >60.00)
Glucose, Bld: 107 mg/dL — ABNORMAL HIGH (ref 70–99)
POTASSIUM: 4.1 meq/L (ref 3.5–5.1)
SODIUM: 142 meq/L (ref 135–145)

## 2016-08-13 LAB — HEMOGLOBIN A1C: HEMOGLOBIN A1C: 5.6 % (ref 4.6–6.5)

## 2016-08-13 LAB — LDL CHOLESTEROL, DIRECT: LDL DIRECT: 86 mg/dL

## 2016-08-13 LAB — TSH: TSH: 2.85 u[IU]/mL (ref 0.35–4.50)

## 2016-08-13 LAB — PSA: PSA: 0.52 ng/mL (ref 0.10–4.00)

## 2016-08-20 ENCOUNTER — Ambulatory Visit (INDEPENDENT_AMBULATORY_CARE_PROVIDER_SITE_OTHER): Payer: BLUE CROSS/BLUE SHIELD | Admitting: Family Medicine

## 2016-08-20 ENCOUNTER — Encounter: Payer: Self-pay | Admitting: Family Medicine

## 2016-08-20 VITALS — BP 112/76 | HR 100 | Temp 98.6°F | Ht 70.0 in | Wt 279.0 lb

## 2016-08-20 DIAGNOSIS — R7309 Other abnormal glucose: Secondary | ICD-10-CM

## 2016-08-20 DIAGNOSIS — R739 Hyperglycemia, unspecified: Secondary | ICD-10-CM

## 2016-08-20 DIAGNOSIS — E669 Obesity, unspecified: Secondary | ICD-10-CM | POA: Diagnosis not present

## 2016-08-20 NOTE — Progress Notes (Signed)
Kenneth Berry is a 55 year old married male nonsmoker who comes in today for follow-up of metabolic syndrome  We saw him last March. He is 70 inches tall weight is 280 pounds. He's on antihypertensive therapy with lisinopril 5 mg daily and Lasix 20 mg daily. BP is 112/76. His A1c last spring was 5.7 it's 5.6 now. Blood sugar was 110 is now 107. BMI is 40.30. Total cholesterol 156 HDL 31 LDL 86 much improved.  He says he finds it difficult to exercise because of his lumbar disc disease. 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 I discussed the Mediterranean diet with him carbohydrate free. Does not drink any alcohol.  Physical examination vital signs stable except for weight is noted above.  Impression metabolic syndrome,,,,,,,,,,, continue antihypertensive therapy,,,,,,,,,, stressed diet exercise weight loss Mediterranean diet follow-up in 6 months for complete physical exam

## 2016-08-20 NOTE — Progress Notes (Signed)
Pre visit review using our clinic review tool, if applicable. No additional management support is needed unless otherwise documented below in the visit note. 

## 2016-08-20 NOTE — Patient Instructions (Signed)
Continue blood pressure medications  I would recommend Mediterranean diet to help you lose weight  Return in 6 months for follow-up. Nonfasting labs one week prior

## 2016-11-17 ENCOUNTER — Encounter: Payer: Self-pay | Admitting: Family Medicine

## 2016-11-17 ENCOUNTER — Ambulatory Visit (INDEPENDENT_AMBULATORY_CARE_PROVIDER_SITE_OTHER): Payer: BLUE CROSS/BLUE SHIELD | Admitting: Family Medicine

## 2016-11-17 VITALS — BP 114/74 | HR 90 | Temp 98.2°F | Ht 70.25 in | Wt 278.2 lb

## 2016-11-17 DIAGNOSIS — E349 Endocrine disorder, unspecified: Secondary | ICD-10-CM | POA: Diagnosis not present

## 2016-11-17 DIAGNOSIS — I10 Essential (primary) hypertension: Secondary | ICD-10-CM

## 2016-11-17 DIAGNOSIS — I5032 Chronic diastolic (congestive) heart failure: Secondary | ICD-10-CM | POA: Diagnosis not present

## 2016-11-17 DIAGNOSIS — M5442 Lumbago with sciatica, left side: Secondary | ICD-10-CM

## 2016-11-17 DIAGNOSIS — M5441 Lumbago with sciatica, right side: Secondary | ICD-10-CM

## 2016-11-17 DIAGNOSIS — Z23 Encounter for immunization: Secondary | ICD-10-CM

## 2016-11-17 DIAGNOSIS — I5030 Unspecified diastolic (congestive) heart failure: Secondary | ICD-10-CM | POA: Insufficient documentation

## 2016-11-17 DIAGNOSIS — Z87891 Personal history of nicotine dependence: Secondary | ICD-10-CM | POA: Insufficient documentation

## 2016-11-17 DIAGNOSIS — R7989 Other specified abnormal findings of blood chemistry: Secondary | ICD-10-CM | POA: Insufficient documentation

## 2016-11-17 DIAGNOSIS — G8929 Other chronic pain: Secondary | ICD-10-CM

## 2016-11-17 NOTE — Patient Instructions (Addendum)
See you soon- make sure to ask Roselyn Reef for EKG when you come back- we do not always get people into gowns but please get into one for the next visit  No changes today  Come fasting so we can do testosterone- water only after midnight

## 2016-11-17 NOTE — Assessment & Plan Note (Signed)
S:Has seen neurosurgery Dr. Carloyn Manner- could do surgery but would be back and wouldn't help.  wont take pain medicine- hydrocodone in past pain management. Hydrocodone 2 of the 10mg  tablets every 4-6 hours. Didn't note that much differenc wiith wean. Tramadol doesn't help.   A/P:Weight loss encouraged as extra weight can certainly strain his back.

## 2016-11-17 NOTE — Progress Notes (Signed)
Subjective:  Kenneth Berry is a 55 y.o. year old very pleasant male patient who presents for/with See problem oriented charting ROS- some shortness of breath but hard to exercise due to back pain so deconditioning element. No chest pain. No edema on lasix. n ocough.    Past Medical History-  Patient Active Problem List   Diagnosis Date Noted  . Diastolic CHF ([place]) XX123456    Priority: High  . Low testosterone 11/17/2016    Priority: Medium  . Elevated blood sugar 11/26/2015    Priority: Medium  . Chronic low back pain 08/15/2014    Priority: Medium  . OSA (obstructive sleep apnea) 09/08/2013    Priority: Medium  . SOB (shortness of breath) on exertion 06/16/2013    Priority: Medium  . Bright red rectal bleeding 06/16/2013    Priority: Medium  . Essential hypertension, benign 03/05/2009    Priority: Medium  . former smoker 7.5 pack years quit 1990 11/17/2016    Priority: Low  . Obesity 11/26/2015    Priority: Low  . 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 furosemide (LASIX) 20 MG tablet TAKE ONE TABLET BY MOUTH ONCE DAILY IN THE MORNING 100 tablet 3  . lisinopril (PRINIVIL,ZESTRIL) 5 MG tablet Take 1 tablet (5 mg total) by mouth daily. 90 tablet 3  . sildenafil (REVATIO) 20 MG tablet Take 1 tablet (20 mg total) by mouth 3 (three) times daily. 30 tablet 6   No current facility-administered medications for this visit.     Objective: BP 114/74 (BP Location: Left Arm, Patient Position: Sitting, Cuff Size: Large)   Pulse 90   Temp 98.2 F (36.8 C) (Oral)   Ht 5' 10.25" (1.784 m)   Wt 278 lb 3.2 oz (126.2 kg)   SpO2 96%   BMI 39.63 kg/m  Gen: NAD, resting comfortably CV: RRR no murmurs rubs or gallops Lungs: CTAB no crackles, wheeze, rhonchi Abdomen: soft/nontender/nondistended/normal bowel sounds. obese Ext: no edema Skin: warm,  dry  Assessment/Plan:   Diastolic CHF ([facility]) S: Echo diastolic dysfunction. Lasix controls edema. Cardiology did not note CHF as cause of SOB though. Pulmonology did not find primary pulmonology cause. Ultimately thought to have element of deconditioning. He is compliant with 20mg  lasix A/P: continue current medication, discussed graduated exercise routine given prior clearance from cardiology for this. Given back pain- focus would be on stiationary bike given pain with walking.   Low testosterone S: treated around 2012 short term with improvement in levels.  A/P: he asks to be retested and wants endocrine referral if low- unclear what primary symptoms are but he seems to think fatigue may be associated (still needs graduated exercise program)   Essential hypertension, benign S: controlled on lisinopril 5mg  BP Readings from Last 3 Encounters:  11/17/16 114/74  08/20/16 112/76  05/17/16 115/80  A/P:Continue current meds:  Doing well   Chronic low back pain S:Has seen neurosurgery Dr. Carloyn Manner- could do surgery but would be back and wouldn't help.  wont take pain medicine- hydrocodone in past pain management. Hydrocodone 2 of the 10mg  tablets every 4-6 hours. Didn't note that much differenc wiith wean. Tramadol doesn't help.   A/P:Weight loss encouraged as extra weight can certainly strain his back.    CPE in a week to focus on preventative measures  Orders Placed This Encounter  Procedures  . Flu Vaccine QUAD 36+ mos IM  Return precautions advised.  Garret Reddish, MD

## 2016-11-17 NOTE — Assessment & Plan Note (Addendum)
S: Echo diastolic dysfunction. Lasix controls edema. Cardiology did not note CHF as cause of SOB though. Pulmonology did not find primary pulmonology cause. Ultimately thought to have element of deconditioning. He is compliant with 20mg  lasix A/P: continue current medication, discussed graduated exercise routine given prior clearance from cardiology for this. Given back pain- focus would be on stiationary bike given pain with walking.

## 2016-11-17 NOTE — Progress Notes (Signed)
Pre visit review using our clinic review tool, if applicable. No additional management support is needed unless otherwise documented below in the visit note. 

## 2016-11-17 NOTE — Assessment & Plan Note (Signed)
S: controlled on lisinopril 5mg  BP Readings from Last 3 Encounters:  11/17/16 114/74  08/20/16 112/76  05/17/16 115/80  A/P:Continue current meds:  Doing well

## 2016-11-17 NOTE — Assessment & Plan Note (Signed)
S: treated around 2012 short term with improvement in levels.  A/P: he asks to be retested and wants endocrine referral if low- unclear what primary symptoms are but he seems to think fatigue may be associated (still needs graduated exercise program)

## 2016-11-26 ENCOUNTER — Encounter: Payer: Self-pay | Admitting: Family Medicine

## 2016-11-26 ENCOUNTER — Ambulatory Visit (INDEPENDENT_AMBULATORY_CARE_PROVIDER_SITE_OTHER): Payer: BLUE CROSS/BLUE SHIELD | Admitting: Family Medicine

## 2016-11-26 ENCOUNTER — Other Ambulatory Visit: Payer: Self-pay | Admitting: Family Medicine

## 2016-11-26 VITALS — BP 104/66 | HR 85 | Temp 98.0°F | Ht 70.0 in | Wt 276.0 lb

## 2016-11-26 DIAGNOSIS — R7989 Other specified abnormal findings of blood chemistry: Secondary | ICD-10-CM

## 2016-11-26 DIAGNOSIS — N529 Male erectile dysfunction, unspecified: Secondary | ICD-10-CM | POA: Diagnosis not present

## 2016-11-26 DIAGNOSIS — Z1159 Encounter for screening for other viral diseases: Secondary | ICD-10-CM

## 2016-11-26 DIAGNOSIS — E349 Endocrine disorder, unspecified: Secondary | ICD-10-CM

## 2016-11-26 DIAGNOSIS — Z0000 Encounter for general adult medical examination without abnormal findings: Secondary | ICD-10-CM | POA: Diagnosis not present

## 2016-11-26 DIAGNOSIS — Z7289 Other problems related to lifestyle: Secondary | ICD-10-CM

## 2016-11-26 MED ORDER — LISINOPRIL 10 MG PO TABS: 5.0000 mg | ORAL_TABLET | Freq: Every day | ORAL | 3 refills | Status: DC

## 2016-11-26 MED ORDER — FUROSEMIDE 20 MG PO TABS: ORAL_TABLET | ORAL | 3 refills | Status: DC

## 2016-11-26 MED ORDER — SILDENAFIL CITRATE 20 MG PO TABS: 20.0000 mg | ORAL_TABLET | Freq: Three times a day (TID) | ORAL | 6 refills | Status: DC

## 2016-11-26 NOTE — Progress Notes (Signed)
Pre visit review using our clinic review tool, if applicable. No additional management support is needed unless otherwise documented below in the visit note. 

## 2016-11-26 NOTE — Patient Instructions (Addendum)
Biggest thing for you is weight loss. Setting modest goal of 10 lbs weight loss in 3 months. Consider weight watchers. Continue 10 minutes a day and build gradually 1-2 minutes a week. Veggies and water are your best friend!   Everything else looks great

## 2016-11-26 NOTE — Progress Notes (Addendum)
Phone: [phone]  Subjective:  Patient presents today for their annual physical. Chief complaint-noted.   See problem oriented charting- ROS- full  review of systems was completed and negative except for: some fatigue, some shortness of breath with activity not worsening  The following were reviewed and entered/updated in epic: Past Medical History:  Diagnosis Date  . Allergy    seasonal spring the worst   . Arthritis    spine,neck  . Hemorrhoid    occasionally bleeds   . Hypertension   . Sleep apnea    wears cpap   Patient Active Problem List   Diagnosis Date Noted  . Diastolic CHF ([place]) XX123456    Priority: High  . Low testosterone 11/17/2016    Priority: Medium  . Elevated blood sugar 11/26/2015    Priority: Medium  . Chronic low back pain 08/15/2014    Priority: Medium  . OSA (obstructive sleep apnea) 09/08/2013    Priority: Medium  . SOB (shortness of breath) on exertion 06/16/2013    Priority: Medium  . Bright red rectal bleeding 06/16/2013    Priority: Medium  . Essential hypertension, benign 03/05/2009    Priority: Medium  . former smoker 7.5 pack years quit 1990 11/17/2016    Priority: Low  . Obesity 11/26/2015    Priority: Low  . 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 Neck fusion    . TONSILLECTOMY AND ADENOIDECTOMY    . UVULOPALATOPHARYNGOPLASTY      Family History  Problem Relation Age of Onset  . Arrhythmia Father     atrial flutter  . Heart failure Father     7  . Parkinson's disease Father     since 77s  . Other Mother     7 years old healthy  . Cancer Maternal Grandfather     lung  . Bipolar disorder Sister     full  . Healthy Brother     full  . Healthy Brother     half brother- neck and back issues  . Healthy Brother     half brother  . Colon cancer Neg Hx   . Colon polyps Neg Hx   .  Esophageal cancer Neg Hx   . Rectal cancer Neg Hx   . Stomach cancer Neg Hx     Medications- reviewed and updated Current Outpatient Prescriptions  Medication Sig Dispense Refill  . furosemide (LASIX) 20 MG tablet TAKE ONE TABLET BY MOUTH ONCE DAILY IN THE MORNING 100 tablet 3  . lisinopril (PRINIVIL,ZESTRIL) 10 MG tablet Take 0.5 tablets (5 mg total) by mouth daily. 46 tablet 3  . sildenafil (REVATIO) 20 MG tablet Take 1 tablet (20 mg total) by mouth 3 (three) times daily. 30 tablet 6   No current facility-administered medications for this visit.     Allergies-reviewed and updated No Known Allergies  Social History   Social History  . Marital status: Married    Spouse name: N/A  . Number of children: 2  . Years of education: N/A   Occupational History  . [place] History Main Topics  . Smoking status: Former Smoker    Packs/day: 0.75    Years: 10.00    Types: Cigarettes    Quit date: 12/08/1988  . Smokeless tobacco: Former Systems developer  . Alcohol use No  . Drug use: No  .  Sexual activity: Not Asked   Other Topics Concern  . None   Social History Narrative   Married. 2 adopted children (son at 3.5 months, daughter- sibling to the son).       [employer]- produce      Hobbies: video games  (CPU- plays skyrim as of 2017)      Thinking about biking- no regular exercise    Objective: BP 104/66 (BP Location: Left Arm, Patient Position: Sitting, Cuff Size: Large)   Pulse 85   Temp 98 F (36.7 C) (Oral)   Ht 5\' 10"  (1.778 m)   Wt 276 lb (125.2 kg)   SpO2 94%   BMI 39.60 kg/m  Gen: NAD, resting comfortably HEENT: Mucous membranes are moist. Oropharynx normal Neck: no thyromegaly CV: RRR no murmurs rubs or gallops Lungs: CTAB no crackles, wheeze, rhonchi Abdomen: soft/nontender/nondistended/normal bowel sounds. No rebound or guarding. obese Ext: no edema Skin: warm, dry Neuro: grossly normal, moves all extremities, PERRLA Rectal: normal tone,noramal sized  prostate, no masses or tenderness   Assessment/Plan:  55 y.o. male presenting for annual physical.  Health Maintenance counseling: 1. Anticipatory guidance: Patient counseled regarding regular dental exams (not going0advidsed to do so), eye exams (goes yearly), wearing seatbelts.  2. Risk factor reduction:  Advised patient of need for regular exercise and diet rich and fruits and vegetables to reduce risk of heart attack and stroke. Weight goal down 10 lbs in 3 montshs- reach out to patient. He started 10 minutes daily on exercise bike. Working on diet.  3. Immunizations/screenings/ancillary studies Immunization History  Administered Date(s) Administered  . Influenza,inj,Quad PF,36+ Mos 08/15/2014, 11/26/2015, 11/17/2016  . Rabies, IM 05/03/2016, 05/06/2016, 05/10/2016, 05/17/2016  . Td 12/08/2001  . Tdap 08/15/2014   Health Maintenance Due  Topic Date Due  . Hepatitis C Screening  [birth year]   4. Prostate cancer screening- low risk rectal and PSA trend  Lab Results  Component Value Date   PSA 0.52 08/12/2016   PSA 0.39 11/19/2015   PSA 0.33 07/24/2014   5. Colon cancer screening - 01/2016 with 10 year follow up 6. Skin cancer prevention- advised regular sunscreen use  Status of chronic or acute concerns  HTnN- controlled. High triglycerides- focus on weight loss over next year 10 lbs every 3 months and recheck- if not improved consider statin next year. ED- refill cialis  High triglycerides- handout given, weight loss focus- recheck in a year- consider statin if not improving to under 200  Prior low testosterone- check levels today about 8 50 at time of test   3 month mychart check in for weight, 1 year CPE or in office if not making progress  Orders Placed This Encounter  Procedures  . Testosterone, Free, Total, SHBG  . Hepatitis C antibody, reflex    [facility]    Meds ordered this encounter  Medications  . sildenafil (REVATIO) 20 MG tablet    Sig: Take 1 tablet (20 mg  total) by mouth 3 (three) times daily.    Dispense:  30 tablet    Refill:  6  . furosemide (LASIX) 20 MG tablet    Sig: TAKE ONE TABLET BY MOUTH ONCE DAILY IN THE MORNING    Dispense:  100 tablet    Refill:  3  . lisinopril (PRINIVIL,ZESTRIL) 10 MG tablet    Sig: Take 0.5 tablets (5 mg total) by mouth daily.    Dispense:  46 tablet    Refill:  3    Return precautions advised.  Garret Reddish, MD

## 2016-11-26 NOTE — Addendum Note (Signed)
Addended by: Elmer Picker on: 11/26/2016 08:55 AM   Modules accepted: Orders

## 2016-11-27 LAB — HEPATITIS C ANTIBODY: HCV Ab: NEGATIVE

## 2016-11-27 LAB — TESTOSTERONE TOTAL,FREE,BIO, MALES
ALBUMIN: 4.4 g/dL (ref 3.6–5.1)
SEX HORMONE BINDING: 16 nmol/L (ref 10–50)
Testosterone: 171 ng/dL — ABNORMAL LOW (ref 250–827)

## 2016-11-28 LAB — TESTOSTERONE, FREE AND TOTAL (INCLUDES SHBG)-(MALES)
Sex Hormone Binding: 16 nmol/L (ref 10–50)
TESTOSTERONE FREE: 45.9 pg/mL — AB (ref 47.0–244.0)
TESTOSTERONE-% FREE: 2.7 % (ref 1.6–2.9)
Testosterone: 171 ng/dL — ABNORMAL LOW (ref 250–827)

## 2016-12-11 ENCOUNTER — Encounter: Payer: Self-pay | Admitting: Family Medicine

## 2016-12-12 ENCOUNTER — Encounter: Payer: Self-pay | Admitting: Family Medicine

## 2016-12-15 ENCOUNTER — Other Ambulatory Visit: Payer: BLUE CROSS/BLUE SHIELD

## 2016-12-15 DIAGNOSIS — R7989 Other specified abnormal findings of blood chemistry: Secondary | ICD-10-CM

## 2016-12-16 LAB — CP2130 TESTOSTERONE, TOTAL AND FREE
Sex Hormone Binding: 19 nmol/L (ref 10–50)
TESTOSTERONE-% FREE: 2.5 % (ref 1.6–2.9)
Testosterone, Free: 47.8 pg/mL (ref 47.0–244.0)
Testosterone: 190 ng/dL — ABNORMAL LOW (ref 300–890)

## 2017-01-27 ENCOUNTER — Encounter: Payer: Self-pay | Admitting: Family Medicine

## 2017-02-10 ENCOUNTER — Other Ambulatory Visit: Payer: Self-pay | Admitting: Family Medicine

## 2017-02-25 ENCOUNTER — Encounter: Payer: Self-pay | Admitting: Family Medicine

## 2017-05-05 ENCOUNTER — Other Ambulatory Visit: Payer: Self-pay | Admitting: Family Medicine

## 2017-05-05 DIAGNOSIS — I10 Essential (primary) hypertension: Secondary | ICD-10-CM

## 2017-10-06 ENCOUNTER — Encounter: Payer: Self-pay | Admitting: Family Medicine

## 2017-10-06 ENCOUNTER — Ambulatory Visit (INDEPENDENT_AMBULATORY_CARE_PROVIDER_SITE_OTHER): Payer: BLUE CROSS/BLUE SHIELD | Admitting: Family Medicine

## 2017-10-06 VITALS — BP 120/76 | HR 92 | Temp 98.1°F | Ht 70.0 in | Wt 280.0 lb

## 2017-10-06 DIAGNOSIS — L729 Follicular cyst of the skin and subcutaneous tissue, unspecified: Secondary | ICD-10-CM

## 2017-10-06 DIAGNOSIS — L089 Local infection of the skin and subcutaneous tissue, unspecified: Secondary | ICD-10-CM

## 2017-10-06 DIAGNOSIS — Z23 Encounter for immunization: Secondary | ICD-10-CM

## 2017-10-06 MED ORDER — DOXYCYCLINE HYCLATE 100 MG PO TABS: 100.0000 mg | ORAL_TABLET | Freq: Two times a day (BID) | ORAL | 0 refills | Status: AC

## 2017-10-06 NOTE — Patient Instructions (Addendum)
Infected cyst  Send purulent material for culture   Start doxycycline 100mg  twice a day for 7 days  May want to schedule tylenol 650mg  every 6 hours or so for first day  See me back on Friday for recheck - one of the same day slots 3 pm or later Pull packing out about an inch or two and cut it each day

## 2017-10-06 NOTE — Progress Notes (Signed)
Subjective:  Kenneth Berry is a 56 y.o. year old very pleasant male patient who presents for/with See problem oriented charting ROS- no fever or chills. No nausea or vomiting. Does have left neck pain.    Past Medical History-  Patient Active Problem List   Diagnosis Date Noted  . Diastolic CHF ([place]) 21/19/4174    Priority: High  . Low testosterone 11/17/2016    Priority: Medium  . Elevated blood sugar 11/26/2015    Priority: Medium  . Chronic low back pain 08/15/2014    Priority: Medium  . OSA (obstructive sleep apnea) 09/08/2013    Priority: Medium  . SOB (shortness of breath) on exertion 06/16/2013    Priority: Medium  . Bright red rectal bleeding 06/16/2013    Priority: Medium  . Essential hypertension, benign 03/05/2009    Priority: Medium  . former smoker 7.5 pack years quit 1990 11/17/2016    Priority: Low  . Obesity 11/26/2015    Priority: Low  . 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 furosemide (LASIX) 20 MG tablet TAKE ONE TABLET BY MOUTH ONCE DAILY IN THE MORNING 100 tablet 3  . lisinopril (PRINIVIL,ZESTRIL) 10 MG tablet Take 0.5 tablets (5 mg total) by mouth daily. 46 tablet 3  . lisinopril-hydrochlorothiazide (PRINZIDE,ZESTORETIC) 10-12.5 MG tablet TAKE ONE TABLET BY MOUTH ONCE DAILY 90 tablet 3  . sildenafil (REVATIO) 20 MG tablet Take 1 tablet (20 mg total) by mouth 3 (three) times daily. 30 tablet 6  . doxycycline (VIBRA-TABS) 100 MG tablet Take 1 tablet (100 mg total) by mouth 2 (two) times daily. 14 tablet 0   No current facility-administered medications for this visit.     Objective: BP 120/76 (BP Location: Left Arm, Patient Position: Sitting, Cuff Size: Large)   Pulse 92   Temp 98.1 F (36.7 C) (Oral)   Ht 5\' 10"  (1.778 m)   Wt 280 lb (127 kg)   SpO2 95%   BMI 40.18 kg/m  Gen: NAD, resting comfortably CV: RRR no murmurs  rubs or gallops Lungs: CTAB no crackles, wheeze, rhonchi Ext: no edema Skin: warm, dry 6 c 6 indurated area on back of left neck below hairline. Fluctuant area in about 2 x 2 cm area. Entire area tender to touch, erythematous  Procedure:  Incision and drainage of abscess Risks, benefits, and alternatives explained and verbal consent obtained. Surface cleaned with alcohol. 10 cc lidocaine with epinephine infiltrated around abscess. Adequate anesthesia ensured. #11 blade used to make a stab incision into abscess. Pus expressed with pressure. Curved hemostat used to explore 4 quadrants and loculations broken up. Further purulence expressed. 5 inches of iodoform packing placed leaving a 1-inch tail. Hemostasis achieved. Pt stable.He tolerated procedure remarkably well- minimal pain Aftercare and follow-up advised.   Assessment/Plan:  Infected cyst of skin - Plan: WOUND CULTURE S: Cyst present for years on left neck below hairline. 3-6 months enlarged to marble size. Then over last 1-2 weeks enlarged drastically when at beach. No clear trigger. Patient with moderate level aching pain. Has tried some warm compresses- no drainage yet. No fever or chills. Area expanding, red, swelling.  A/P: Infected cyst of neck- epidermoid vs. Pilar. I+D completed as above. Will get culture per patient request- discussed likely mrsa. Start doxycycline given size and extent of cyst- also with depth of cyst did use packing and will do Friday follow up.  Future Appointments Date Time Provider [place]  10/09/2017 3:00 PM Marin Olp, MD [facility] None  11/27/2017 10:45 AM Yong Channel Brayton Mars, MD [facility] None   Orders Placed This Encounter  Procedures  . WOUND CULTURE    [facility]    Order Specific Question:   Source    Answer:   left neck - infected cyst  . Flu Vaccine QUAD 36+ mos IM   Meds ordered this encounter  Medications  . doxycycline (VIBRA-TABS) 100 MG tablet    Sig: Take 1  tablet (100 mg total) by mouth 2 (two) times daily.    Dispense:  14 tablet    Refill:  0   Return precautions advised.  Garret Reddish, MD

## 2017-10-09 ENCOUNTER — Ambulatory Visit (INDEPENDENT_AMBULATORY_CARE_PROVIDER_SITE_OTHER): Payer: BLUE CROSS/BLUE SHIELD | Admitting: Family Medicine

## 2017-10-09 ENCOUNTER — Encounter: Payer: Self-pay | Admitting: Family Medicine

## 2017-10-09 VITALS — BP 106/72 | HR 95 | Temp 98.1°F | Ht 70.0 in | Wt 282.4 lb

## 2017-10-09 DIAGNOSIS — L729 Follicular cyst of the skin and subcutaneous tissue, unspecified: Secondary | ICD-10-CM

## 2017-10-09 DIAGNOSIS — L089 Local infection of the skin and subcutaneous tissue, unspecified: Secondary | ICD-10-CM

## 2017-10-09 LAB — WOUND CULTURE
MICRO NUMBER:: 81215500
RESULT: NO GROWTH
SPECIMEN QUALITY: ADEQUATE

## 2017-10-09 NOTE — Patient Instructions (Addendum)
Great job caring for this- continue to change dressing at least once a day- when doesn't drain for 24 hours can stop using the dressing- preferably once area has dried more than it has so far.   10-14 days for wound to close likely. May take 1 to 2 months for swollen area to resolve but no signs of pus today which is good news.   See [place] back for expanding redness or worsening pain

## 2017-10-09 NOTE — Progress Notes (Signed)
Wound recheck after I+D on the 30th.  1/10 pain today down from 4-5/10 pain. No fever.  Still on doxyycline.  About 25% improvement in size of induration. Still draining. Packing fell out this morning.   This should be part of global charge for I+D and packing. LOS- no charge today

## 2017-10-15 ENCOUNTER — Encounter: Payer: Self-pay | Admitting: Family Medicine

## 2017-11-27 ENCOUNTER — Telehealth: Payer: Self-pay | Admitting: Family Medicine

## 2017-11-27 ENCOUNTER — Ambulatory Visit (INDEPENDENT_AMBULATORY_CARE_PROVIDER_SITE_OTHER): Payer: BLUE CROSS/BLUE SHIELD | Admitting: Family Medicine

## 2017-11-27 ENCOUNTER — Encounter: Payer: Self-pay | Admitting: Family Medicine

## 2017-11-27 VITALS — BP 112/80 | HR 86 | Temp 97.5°F | Ht 71.0 in | Wt 275.6 lb

## 2017-11-27 DIAGNOSIS — I5032 Chronic diastolic (congestive) heart failure: Secondary | ICD-10-CM | POA: Diagnosis not present

## 2017-11-27 DIAGNOSIS — Z0000 Encounter for general adult medical examination without abnormal findings: Secondary | ICD-10-CM

## 2017-11-27 DIAGNOSIS — R7989 Other specified abnormal findings of blood chemistry: Secondary | ICD-10-CM

## 2017-11-27 DIAGNOSIS — E785 Hyperlipidemia, unspecified: Secondary | ICD-10-CM | POA: Insufficient documentation

## 2017-11-27 DIAGNOSIS — R739 Hyperglycemia, unspecified: Secondary | ICD-10-CM

## 2017-11-27 DIAGNOSIS — N529 Male erectile dysfunction, unspecified: Secondary | ICD-10-CM | POA: Diagnosis not present

## 2017-11-27 DIAGNOSIS — Z125 Encounter for screening for malignant neoplasm of prostate: Secondary | ICD-10-CM | POA: Diagnosis not present

## 2017-11-27 DIAGNOSIS — I10 Essential (primary) hypertension: Secondary | ICD-10-CM | POA: Diagnosis not present

## 2017-11-27 LAB — CBC
HCT: 45.1 % (ref 39.0–52.0)
Hemoglobin: 14.6 g/dL (ref 13.0–17.0)
MCHC: 32.5 g/dL (ref 30.0–36.0)
MCV: 86.8 fl (ref 78.0–100.0)
Platelets: 223 10*3/uL (ref 150.0–400.0)
RBC: 5.2 Mil/uL (ref 4.22–5.81)
RDW: 13.9 % (ref 11.5–15.5)
WBC: 8.5 10*3/uL (ref 4.0–10.5)

## 2017-11-27 LAB — COMPREHENSIVE METABOLIC PANEL
ALBUMIN: 4.4 g/dL (ref 3.5–5.2)
ALT: 18 U/L (ref 0–53)
AST: 15 U/L (ref 0–37)
Alkaline Phosphatase: 59 U/L (ref 39–117)
BILIRUBIN TOTAL: 0.5 mg/dL (ref 0.2–1.2)
BUN: 13 mg/dL (ref 6–23)
CALCIUM: 8.9 mg/dL (ref 8.4–10.5)
CO2: 30 mEq/L (ref 19–32)
CREATININE: 0.99 mg/dL (ref 0.40–1.50)
Chloride: 101 mEq/L (ref 96–112)
GFR: 82.96 mL/min (ref >60.00)
Glucose, Bld: 89 mg/dL (ref 70–99)
Potassium: 4.3 mEq/L (ref 3.5–5.1)
Sodium: 139 mEq/L (ref 135–145)
Total Protein: 7.2 g/dL (ref 6.0–8.3)

## 2017-11-27 LAB — PSA: PSA: 0.52 ng/mL (ref 0.10–4.00)

## 2017-11-27 LAB — LIPID PANEL
CHOLESTEROL: 147 mg/dL (ref 0–200)
HDL: 33.3 mg/dL — ABNORMAL LOW (ref >39.00)
LDL Cholesterol: 83 mg/dL (ref 0–99)
NonHDL: 113.61
TRIGLYCERIDES: 152 mg/dL — AB (ref 0.0–149.0)
Total CHOL/HDL Ratio: 4
VLDL: 30.4 mg/dL (ref 0.0–40.0)

## 2017-11-27 LAB — HEMOGLOBIN A1C: HEMOGLOBIN A1C: 5.6 % (ref 4.6–6.5)

## 2017-11-27 MED ORDER — SILDENAFIL CITRATE 20 MG PO TABS: 20.0000 mg | ORAL_TABLET | Freq: Three times a day (TID) | ORAL | 6 refills | Status: DC

## 2017-11-27 NOTE — Telephone Encounter (Signed)
See note

## 2017-11-27 NOTE — Assessment & Plan Note (Signed)
BP controlled on lisinorpil 5mg  (half of 10mg  tablet) and lasix 20mg  BP Readings from Last 3 Encounters:  11/27/17 112/80  10/09/17 106/72  10/06/17 120/76

## 2017-11-27 NOTE — Assessment & Plan Note (Signed)
Denies symptoms low testosterone- declines repeat testing. Was low normal on repeat last year.

## 2017-11-27 NOTE — Addendum Note (Signed)
Addended by: Lucianne Lei M on: 11/27/2017 12:17 PM   Modules accepted: Orders

## 2017-11-27 NOTE — Progress Notes (Signed)
Phone: [phone]  Subjective:  Patient presents today for their annual physical. Chief complaint-noted.   See problem oriented charting- ROS- full  review of systems was completed and negative except for: leg swelling and shortness of breath- stable to slightly improved for both  The following were reviewed and entered/updated in epic: Past Medical History:  Diagnosis Date  . Allergy    seasonal spring the worst   . Arthritis    spine,neck  . Hemorrhoid    occasionally bleeds   . Hypertension   . Sleep apnea    wears cpap   Patient Active Problem List   Diagnosis Date Noted  . Diastolic CHF ([place]) 65/99/3570    Priority: High  . Hyperlipidemia 11/27/2017    Priority: Medium  . Low testosterone 11/17/2016    Priority: Medium  . Elevated blood sugar 11/26/2015    Priority: Medium  . Chronic low back pain 08/15/2014    Priority: Medium  . OSA (obstructive sleep apnea) 09/08/2013    Priority: Medium  . SOB (shortness of breath) on exertion 06/16/2013    Priority: Medium  . Bright red rectal bleeding 06/16/2013    Priority: Medium  . Essential hypertension, benign 03/05/2009    Priority: Medium  . former smoker 7.5 pack years quit 1990 11/17/2016    Priority: Low  . Obesity 11/26/2015    Priority: Low  . 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 Neck fusion    . TONSILLECTOMY AND ADENOIDECTOMY    . UVULOPALATOPHARYNGOPLASTY      Family History  Problem Relation Age of Onset  . Arrhythmia Father        atrial flutter  . Heart failure Father        64  . Parkinson's disease Father        since 39s  . Other Mother        29 years old healthy  . Cancer Maternal Grandfather        lung  . Bipolar disorder Sister        full  . Healthy Brother        full  . Healthy Brother        half brother- neck and back issues  . Healthy  Brother        half brother  . Colon cancer Neg Hx   . Colon polyps Neg Hx   . Esophageal cancer Neg Hx   . Rectal cancer Neg Hx   . Stomach cancer Neg Hx     Medications- reviewed and updated Current Outpatient Medications  Medication Sig Dispense Refill  . furosemide (LASIX) 20 MG tablet TAKE ONE TABLET BY MOUTH ONCE DAILY IN THE MORNING 100 tablet 3  . lisinopril (PRINIVIL,ZESTRIL) 10 MG tablet Take 0.5 tablets (5 mg total) by mouth daily. 46 tablet 3  . sildenafil (REVATIO) 20 MG tablet Take 1 tablet (20 mg total) by mouth 3 (three) times daily. 30 tablet 6   No current facility-administered medications for this visit.     Allergies-reviewed and updated No Known Allergies  Social History   Socioeconomic History  . Marital status: Married    Spouse name: None  . Number of children: 2  . Years of education: None  . Highest education level: None  Social Needs  . Financial resource strain: None  . Food insecurity -  worry: None  . Food insecurity - inability: None  . Transportation needs - medical: None  . Transportation needs - non-medical: None  Occupational History  . Occupation: Farmer  Tobacco Use  . Smoking status: Former Smoker    Packs/day: 0.75    Years: 10.00    Pack years: 7.50    Types: Cigarettes    Last attempt to quit: 12/08/1988    Years since quitting: 28.9  . Smokeless tobacco: Former Network engineer and Sexual Activity  . Alcohol use: No    Alcohol/week: 0.0 oz  . Drug use: No  . Sexual activity: None  Other Topics Concern  . None  Social History Narrative   Married. 2 adopted children (son at 3.5 months, daughter- sibling to the son).       [employer]- produce      Hobbies: video games  (CPU- plays skyrim as of 2017)      Thinking about biking- no regular exercise    Objective: BP 112/80   Pulse 86   Temp (!) 97.5 F (36.4 C) (Oral)   Ht 5\' 11"  (1.803 m)   Wt 275 lb 9.6 oz (125 kg)   SpO2 95%   BMI 38.44 kg/m  Gen: NAD, resting  comfortably HEENT: Mucous membranes are moist. Oropharynx normal Neck: no thyromegaly CV: RRR no murmurs rubs or gallops Lungs: CTAB no crackles, wheeze, rhonchi Abdomen: soft/nontender/nondistended/normal bowel sounds. No rebound or guarding.  Ext: trace edema Skin: warm, dry Neuro: grossly normal, moves all extremities, PERRLA Rectal: normal tone, normal sized prostate, no masses or tenderness. Perhaps slight asymmetry on left side- will monitor exam and PSA  Assessment/Plan:  56 y.o. male presenting for annual physical.  Health Maintenance counseling: 1. Anticipatory guidance: Patient counseled regarding regular dental exams -q6 months (needs to update this), eye exams -yearly, wearing seatbelts.  2. Risk factor reduction:  Advised patient of need for regular exercise and diet rich and fruits and vegetables to reduce risk of heart attack and stroke. Got all the way down to 260 on home scales- started having time crunch and meal preparation suffered and gained some back but still lower than last visit. Exercise - active with work but not doing regular exercise like he should- discussed importance.  Wt Readings from Last 3 Encounters:  11/27/17 275 lb 9.6 oz (125 kg)  10/09/17 282 lb 6.4 oz (128.1 kg)  10/06/17 280 lb (127 kg)  3. Immunizations/screenings/ancillary studies- discuss shingrix shot next year though hes not sure he had chicken pox Immunization History  Administered Date(s) Administered  . Influenza,inj,Quad PF,6+ Mos 08/15/2014, 11/26/2015, 11/17/2016, 10/06/2017  . Rabies, IM 05/03/2016, 05/06/2016, 05/10/2016, 05/17/2016  . Td 12/08/2001  . Tdap 08/15/2014  4. Prostate cancer screening- low risk psa trend, update this today . Low risk rectal - monitor slight asymmetry on left side- more prominent Lab Results  Component Value Date   PSA 0.52 08/12/2016   PSA 0.39 11/19/2015   PSA 0.33 07/24/2014   5. Colon cancer screening - 01/2016 with 10 year follow up 6. Skin  cancer screening- no dermatologist. advised regular sunscreen use. Denies worrisome, changing, or new skin lesions.   Status of chronic or acute concerns   Diastolic CHF (HCC) Diastolic CHF- Remains on lasix 20mg  for diastolic CHF. Propping legs up at night helps with edema. SOB seems to have improved slightly over time with lasix though persists- likely some deconditioning as well  Essential hypertension, benign BP controlled on lisinorpil 5mg  (half of  10mg  tablet) and lasix 20mg  BP Readings from Last 3 Encounters:  11/27/17 112/80  10/09/17 106/72  10/06/17 120/76     Elevated blood sugar Family history of DM. a1c up to 5.7 in past. CBGs slightly high- update a1c. Discussed weight loss- if triglycerides over 200 still may use statin although may slightly increase diabetes risk  Hyperlipidemia Hyperlipidemia- mainly triglycerides- we will work on weight loss- if triglycerides still high could consider statin until weight loss. Perhaps lower strength statin like lovastatin which would decrease diabetes risk less  Low testosterone Denies symptoms low testosterone- declines repeat testing. Was low normal on repeat last year.   6 months. Set goal of at least 10 lbs off in that time frame.   Orders Placed This Encounter  Procedures  . CBC  . PSA  . Lipid panel  . Comprehensive metabolic panel    [facility]  . Hemoglobin A1c    [facility]   Preventative health care - Plan: CBC, PSA, Lipid panel, Comprehensive metabolic panel, Hemoglobin G9F  Chronic diastolic congestive heart failure (HCC)  Essential hypertension, benign - Plan: CBC, Lipid panel, Comprehensive metabolic panel  Hyperlipidemia, unspecified hyperlipidemia type - Plan: CBC, Lipid panel, Comprehensive metabolic panel  Hyperglycemia - Plan: Hemoglobin A1c  Screening PSA (prostate specific antigen) - Plan: PSA  Elevated blood sugar  Low testosterone   Return precautions advised.  Garret Reddish, MD

## 2017-11-27 NOTE — Assessment & Plan Note (Signed)
Family history of DM. a1c up to 5.7 in past. CBGs slightly high- update a1c. Discussed weight loss- if triglycerides over 200 still may use statin although may slightly increase diabetes risk

## 2017-11-27 NOTE — Assessment & Plan Note (Signed)
Hyperlipidemia- mainly triglycerides- we will work on weight loss- if triglycerides still high could consider statin until weight loss. Perhaps lower strength statin like lovastatin which would decrease diabetes risk less

## 2017-11-27 NOTE — Assessment & Plan Note (Signed)
Diastolic CHF- Remains on lasix 20mg  for diastolic CHF. Propping legs up at night helps with edema. SOB seems to have improved slightly over time with lasix though persists- likely some deconditioning as well

## 2017-11-27 NOTE — Telephone Encounter (Signed)
rec'd call from [employer]; requests clarification of electronic Rx for Sildenafil.  Reported the Rx came through with void over the instructions.  Noted that the order was Sildenafil 20 mg, 1 tid, #30; RFx 6.  Advised pharmacist that will send message to the provider to review the Rx instructions and quantity, and notify of any ammendment.  Pharmacist agreed.

## 2017-11-27 NOTE — Patient Instructions (Addendum)
Please stop by lab before you go  I want you to set a goal of getting 10 lbs off in next 6 months at least- I suspect you may even go past that with the success you had earlier this year.   Go see the dentist  Ask your eye doctor about the night vision question at next visit

## 2017-11-30 MED ORDER — SILDENAFIL CITRATE 20 MG PO TABS: ORAL_TABLET | ORAL | 3 refills | Status: DC

## 2017-11-30 NOTE — Telephone Encounter (Signed)
Appears we refilled rx from Dr. Sherren Mocha. I changed this to how I normally would order medicine.   Garret Reddish

## 2017-11-30 NOTE — Telephone Encounter (Signed)
Please advise on clarification. 

## 2017-12-02 NOTE — Telephone Encounter (Signed)
New prescription was sent in by Dr. Yong Channel.

## 2018-01-29 ENCOUNTER — Other Ambulatory Visit: Payer: Self-pay | Admitting: Family Medicine

## 2018-12-02 ENCOUNTER — Encounter: Payer: BLUE CROSS/BLUE SHIELD | Admitting: Family Medicine

## 2018-12-07 ENCOUNTER — Encounter: Payer: Self-pay | Admitting: Family Medicine

## 2018-12-07 ENCOUNTER — Ambulatory Visit (INDEPENDENT_AMBULATORY_CARE_PROVIDER_SITE_OTHER): Payer: BLUE CROSS/BLUE SHIELD | Admitting: Family Medicine

## 2018-12-07 VITALS — BP 110/76 | HR 84 | Temp 98.2°F | Ht 71.0 in | Wt 279.8 lb

## 2018-12-07 DIAGNOSIS — B351 Tinea unguium: Secondary | ICD-10-CM | POA: Diagnosis not present

## 2018-12-07 DIAGNOSIS — Z0000 Encounter for general adult medical examination without abnormal findings: Secondary | ICD-10-CM

## 2018-12-07 DIAGNOSIS — Z6839 Body mass index (BMI) 39.0-39.9, adult: Secondary | ICD-10-CM

## 2018-12-07 DIAGNOSIS — R739 Hyperglycemia, unspecified: Secondary | ICD-10-CM | POA: Diagnosis not present

## 2018-12-07 DIAGNOSIS — Z23 Encounter for immunization: Secondary | ICD-10-CM

## 2018-12-07 DIAGNOSIS — I10 Essential (primary) hypertension: Secondary | ICD-10-CM

## 2018-12-07 DIAGNOSIS — Z125 Encounter for screening for malignant neoplasm of prostate: Secondary | ICD-10-CM

## 2018-12-07 LAB — LIPID PANEL
CHOLESTEROL: 159 mg/dL (ref 0–200)
HDL: 29.1 mg/dL — AB (ref >39.00)
LDL Cholesterol: 95 mg/dL (ref 0–99)
NonHDL: 130.26
TRIGLYCERIDES: 177 mg/dL — AB (ref 0.0–149.0)
Total CHOL/HDL Ratio: 5
VLDL: 35.4 mg/dL (ref 0.0–40.0)

## 2018-12-07 LAB — CBC
HCT: 44.8 % (ref 39.0–52.0)
Hemoglobin: 15.1 g/dL (ref 13.0–17.0)
MCHC: 33.7 g/dL (ref 30.0–36.0)
MCV: 83.4 fl (ref 78.0–100.0)
Platelets: 215 10*3/uL (ref 150.0–400.0)
RBC: 5.37 Mil/uL (ref 4.22–5.81)
RDW: 13.6 % (ref 11.5–15.5)
WBC: 8.5 10*3/uL (ref 4.0–10.5)

## 2018-12-07 LAB — COMPREHENSIVE METABOLIC PANEL
ALBUMIN: 4.5 g/dL (ref 3.5–5.2)
ALK PHOS: 57 U/L (ref 39–117)
ALT: 17 U/L (ref 0–53)
AST: 13 U/L (ref 0–37)
BILIRUBIN TOTAL: 0.5 mg/dL (ref 0.2–1.2)
BUN: 12 mg/dL (ref 6–23)
CALCIUM: 9.2 mg/dL (ref 8.4–10.5)
CO2: 29 mEq/L (ref 19–32)
CREATININE: 0.95 mg/dL (ref 0.40–1.50)
Chloride: 103 mEq/L (ref 96–112)
GFR: 86.69 mL/min (ref >60.00)
Glucose, Bld: 105 mg/dL — ABNORMAL HIGH (ref 70–99)
Potassium: 3.9 mEq/L (ref 3.5–5.1)
Sodium: 139 mEq/L (ref 135–145)
TOTAL PROTEIN: 7 g/dL (ref 6.0–8.3)

## 2018-12-07 LAB — PSA: PSA: 0.52 ng/mL (ref 0.10–4.00)

## 2018-12-07 LAB — HEMOGLOBIN A1C: HEMOGLOBIN A1C: 5.5 % (ref 4.6–6.5)

## 2018-12-07 MED ORDER — TERBINAFINE HCL 250 MG PO TABS: 250.0000 mg | ORAL_TABLET | Freq: Every day | ORAL | 0 refills | Status: DC

## 2018-12-07 MED ORDER — SILDENAFIL CITRATE 20 MG PO TABS: ORAL_TABLET | ORAL | 3 refills | Status: DC

## 2018-12-07 NOTE — Progress Notes (Signed)
Phone: [phone]  Subjective:  Patient presents today for their annual physical. Chief complaint-noted.   See problem oriented charting- ROS- full  review of systems was completed and negative except for: Dizziness, joint pain, back pain, neck pain, neck stiffness, hemorrhoid bleeding, leg swelling  The following were reviewed and entered/updated in epic: Past Medical History:  Diagnosis Date  . Allergy    seasonal spring the worst   . Arthritis    spine,neck  . Hemorrhoid    occasionally bleeds   . Hypertension   . Sleep apnea    wears cpap   Patient Active Problem List   Diagnosis Date Noted  . Diastolic CHF ([place]) 18/84/1660    Priority: High  . Hyperlipidemia 11/27/2017    Priority: Medium  . Low testosterone 11/17/2016    Priority: Medium  . Elevated blood sugar 11/26/2015    Priority: Medium  . Chronic low back pain 08/15/2014    Priority: Medium  . OSA (obstructive sleep apnea) 09/08/2013    Priority: Medium  . SOB (shortness of breath) on exertion 06/16/2013    Priority: Medium  . Bright red rectal bleeding 06/16/2013    Priority: Medium  . Essential hypertension, benign 03/05/2009    Priority: Medium  . former smoker 7.5 pack years quit 1990 11/17/2016    Priority: Low  . Obesity 11/26/2015    Priority: Low  . 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 Neck fusion    . TONSILLECTOMY AND ADENOIDECTOMY    . UVULOPALATOPHARYNGOPLASTY      Family History  Problem Relation Age of Onset  . Arrhythmia Father        atrial flutter  . Heart failure Father        43  . Parkinson's disease Father        since 19s  . Other Mother        98 years old healthy  . Cancer Maternal Grandfather        lung  . Bipolar disorder Sister        full  . Healthy Brother        full  . Healthy Brother        half brother- neck and back  issues  . Healthy Brother        half brother  . Colon cancer Neg Hx   . Colon polyps Neg Hx   . Esophageal cancer Neg Hx   . Rectal cancer Neg Hx   . Stomach cancer Neg Hx     Medications- reviewed and updated Current Outpatient Medications  Medication Sig Dispense Refill  . furosemide (LASIX) 20 MG tablet TAKE ONE TABLET BY MOUTH ONCE DAILY IN THE MORNING 40 tablet 9  . lisinopril (PRINIVIL,ZESTRIL) 10 MG tablet TAKE ONE-HALF TABLET BY MOUTH ONCE DAILY 45 tablet 4  . sildenafil (REVATIO) 20 MG tablet Take 2-5 tablets as needed once every 48 hours for erectile dysfunction 50 tablet 3   No current facility-administered medications for this visit.     Allergies-reviewed and updated No Known Allergies  Social History   Social History Narrative   Married. 2 adopted children (son at 3.5 months, daughter- sibling to the son).       [employer]- produce      Hobbies: video games  (CPU- plays skyrim as of 2017)  Thinking about biking- no regular exercise    Objective: BP 110/76 (BP Location: Left Arm, Patient Position: Sitting, Cuff Size: Large)   Pulse 84   Temp 98.2 F (36.8 C) (Oral)   Ht 5\' 11"  (1.803 m)   Wt 279 lb 12.8 oz (126.9 kg)   SpO2 96%   BMI 39.02 kg/m  Gen: NAD, resting comfortably HEENT: Mucous membranes are moist. Oropharynx normal Neck: no thyromegaly CV: RRR no murmurs rubs or gallops Lungs: CTAB no crackles, wheeze, rhonchi Abdomen: soft/nontender/nondistended/normal bowel sounds. Morbid obesity Ext: no edema Skin: warm, dry Neuro: grossly normal, moves all extremities, PERRLA  Assessment/Plan:  57 y.o. male presenting for annual physical.  Health Maintenance counseling: 1. Anticipatory guidance: Patient counseled regarding regular dental exams -q6 months, eye exams -yearly,  avoiding smoking and second hand smoke, limiting alcohol to 2 beverages per day - doesn't drink at all.   2. Risk factor reduction:  Advised patient of need for regular  exercise and diet rich and fruits and vegetables to reduce risk of heart attack and stroke. Exercise- has been going to gym intermittently- back has been a set back recently though- encouraged to restart as able. Diet-unfortunately weight up 4 pounds over the last year after prior losing several pounds, states lost 20 lbs toward first of last year and then gained - when he gets busy starts eating out more Wt Readings from Last 3 Encounters:  12/07/18 279 lb 12.8 oz (126.9 kg)  11/27/17 275 lb 9.6 oz (125 kg)  10/09/17 282 lb 6.4 oz (128.1 kg)  3. Immunizations/screenings/ancillary studies-flu shot given today.  Discussed Shingrix and given #1 today-will return in 2 to 5 months for second vaccination-  Immunization History  Administered Date(s) Administered  . Influenza,inj,Quad PF,6+ Mos 08/15/2014, 11/26/2015, 11/17/2016, 10/06/2017  . Rabies, IM 05/03/2016, 05/06/2016, 05/10/2016, 05/17/2016  . Td 12/08/2001  . Tdap 08/15/2014  4. Prostate cancer screening- low risk PSA trend.  Slight asymmetry on the left in the past- updated rectal exam planned- did not complete- will do this next year.  Lab Results  Component Value Date   PSA 0.52 11/27/2017   PSA 0.52 08/12/2016   PSA 0.39 11/19/2015   5. Colon cancer screening - updated colonoscopy on 01/16/2016 with 10-year repeat planned. 6. Skin cancer screening-no dermatologist. advised regular sunscreen use. Denies worrisome, changing, or new skin lesions.  7.  Former smoker-quit 1990 with one 7.5 pack years.  Not a candidate for lung cancer screening.  Will still need AAA scan at 53  Status of chronic or acute concerns  Diastolic CHF-remains on Lasix 20 mg.  Propping his legs up seems to help with the edema cereal.  Does have some shortness of breath with exertion but not worsening. With lasix will get occasional orthostatic intolerance.   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 Also with some joint pain.   Occasional hemorrhoid bleeding.   Hypertension-controlled on lisinopril 5 mg which is half of the 10 mg tablet as well as Lasix 20 mg  Hyperglycemia- has had A1c up to 5.7 in the past.  Most recent CBG and A1c were in normal range.  We will update A1c and CBG today  Hyperlipidemia-primarily triglycerides and we have focused on healthy eating, regular exercise instead of starting statin.  Low testosterone- low normal on check a few years ago.  He  declined repeat testing last year.  Toenail funguns on left foot is 4th toe and 5th toe, 1st, 3rd, 4th and 5th on right foot, also on right index finger.   Needs sildenafil refill- sent [facility] to [facility]   Morbid obesity noted with BMI over 35 with hyperglycemia, hypertension, hyperlipidemia- discussed at least trying to get BMI under 35 and discussed ways to get there.   No problem-specific Assessment & Plan notes found for this encounter.   No future appointments. No follow-ups on file.  Lab/Order associations:FASTING  Need for prophylactic vaccination and inoculation against influenza - Plan: Flu Vaccine QUAD 36+ mos IM  Need for prophylactic vaccination and inoculation against varicella - Plan: Varicella-zoster vaccine IM  No orders of the defined types were placed in this encounter.   Return precautions advised.  Garret Reddish, MD

## 2018-12-07 NOTE — Patient Instructions (Addendum)
Please schedule a nurse visit to receive your 2nd Shingrix vaccine in 2-5 months. Please schedule this before you leave today.  Please stop by lab before you go  Schedule labs in 6 weeks to check on liver. Have to do this with lamisil we are putting you on for 12 weeks as can be tough on the liver in some people.   Would love to see you get down to 240 over next 1-2 years. Lets check in 3-6 months from now to see how you are doing working toward that goal

## 2018-12-09 ENCOUNTER — Telehealth: Payer: Self-pay | Admitting: Family Medicine

## 2018-12-09 NOTE — Telephone Encounter (Signed)
See note  Copied from [place] [phone]. Topic: [employer] and exercise psychologist Patient (Clinic Use ONLY) >> Dec 09, 2018 10:01 AM Lennox Solders wrote: Reason for CRM: pt is returning [place] call concerning blood work results

## 2018-12-09 NOTE — Telephone Encounter (Signed)
Tillie Rung returned patient's call

## 2018-12-22 ENCOUNTER — Emergency Department (HOSPITAL_COMMUNITY)
Admission: EM | Admit: 2018-12-22 | Discharge: 2018-12-22 | Disposition: A | Discharge status: Home / Self Care | Payer: PRIVATE HEALTH INSURANCE | Attending: Emergency Medicine | Admitting: Emergency Medicine

## 2018-12-22 ENCOUNTER — Other Ambulatory Visit: Payer: Self-pay

## 2018-12-22 ENCOUNTER — Encounter (HOSPITAL_COMMUNITY): Payer: Self-pay | Admitting: Emergency Medicine

## 2018-12-22 DIAGNOSIS — Y9389 Activity, other specified: Secondary | ICD-10-CM | POA: Diagnosis not present

## 2018-12-22 DIAGNOSIS — Y9289 Other specified places as the place of occurrence of the external cause: Secondary | ICD-10-CM | POA: Insufficient documentation

## 2018-12-22 DIAGNOSIS — W260XXA Contact with knife, initial encounter: Secondary | ICD-10-CM | POA: Diagnosis not present

## 2018-12-22 DIAGNOSIS — Z79899 Other long term (current) drug therapy: Secondary | ICD-10-CM | POA: Insufficient documentation

## 2018-12-22 DIAGNOSIS — Y998 Other external cause status: Secondary | ICD-10-CM | POA: Diagnosis not present

## 2018-12-22 DIAGNOSIS — I10 Essential (primary) hypertension: Secondary | ICD-10-CM | POA: Diagnosis not present

## 2018-12-22 DIAGNOSIS — S61411A Laceration without foreign body of right hand, initial encounter: Secondary | ICD-10-CM | POA: Diagnosis present

## 2018-12-22 DIAGNOSIS — E785 Hyperlipidemia, unspecified: Secondary | ICD-10-CM | POA: Insufficient documentation

## 2018-12-22 DIAGNOSIS — Z87891 Personal history of nicotine dependence: Secondary | ICD-10-CM | POA: Diagnosis not present

## 2018-12-22 MED ORDER — LIDOCAINE HCL (PF) 1 % IJ SOLN
INTRAMUSCULAR | Status: AC
  Filled 2018-12-22: qty 10

## 2018-12-22 MED ORDER — LIDOCAINE HCL (PF) 1 % IJ SOLN
10.0000 mL | Freq: Once | INTRAMUSCULAR | Status: AC
  Administered 2018-12-22: 10 mL via INTRADERMAL

## 2018-12-22 MED ORDER — POVIDONE-IODINE 10 % EX SOLN
CUTANEOUS | Status: AC
  Filled 2018-12-22: qty 30

## 2018-12-22 NOTE — ED Notes (Signed)
ED Provider at bedside. 

## 2018-12-22 NOTE — ED Provider Notes (Signed)
[facility] EMERGENCY DEPARTMENT Provider Note   CSN: [id number] Arrival date & time: 12/22/18  1019     History   Chief Complaint Chief Complaint  Patient presents with  . Laceration    HPI Kenneth Berry is a 58 y.o. male.  The history is provided by the patient.  Laceration  Location:  Hand Hand laceration location:  L hand Bleeding: controlled   Time since incident:  1 hour Laceration mechanism:  Knife Pain details:    Quality:  Sharp   Severity:  Moderate   Timing:  Constant   Progression:  Unchanged Foreign body present:  No foreign bodies Relieved by:  Nothing Worsened by:  Movement Ineffective treatments:  None tried Tetanus status:  Out of date Associated symptoms: no fever, no focal weakness and no numbness     Past Medical History:  Diagnosis Date  . Allergy    seasonal spring the worst   . Arthritis    spine,neck  . Hemorrhoid    occasionally bleeds   . Hypertension   . Sleep apnea    wears cpap    Patient Active Problem List   Diagnosis Date Noted  . Hyperlipidemia 11/27/2017  . Diastolic CHF ([place]) 93/81/8299  . Low testosterone 11/17/2016  . former smoker 7.5 pack years quit 1990 11/17/2016  . Obesity 11/26/2015  . Elevated blood sugar 11/26/2015  . Chronic low back pain 08/15/2014  . OSA (obstructive sleep apnea) 09/08/2013  . SOB (shortness of breath) on exertion 06/16/2013  . Bright red rectal bleeding 06/16/2013  . 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 Essential hypertension, benign 03/05/2009    Past Surgical History:  Procedure Laterality Date  . 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 Neck fusion    . TONSILLECTOMY AND ADENOIDECTOMY    . UVULOPALATOPHARYNGOPLASTY          Home Medications    Prior to Admission medications   Medication Sig Start Date End Date Taking? Authorizing Provider  furosemide (LASIX) 20 MG tablet TAKE ONE TABLET BY MOUTH ONCE DAILY IN THE MORNING 01/29/18   Marin Olp, MD  lisinopril (PRINIVIL,ZESTRIL) 10 MG tablet TAKE ONE-HALF TABLET BY MOUTH ONCE DAILY 02/01/18   Marin Olp, MD  sildenafil (REVATIO) 20 MG tablet Take 2-5 tablets as needed once every 48 hours for erectile dysfunction 12/07/18   Marin Olp, MD  terbinafine (LAMISIL) 250 MG tablet Take 1 tablet (250 mg total) by mouth daily. 12/07/18   Marin Olp, MD    Family History Family History  Problem Relation Age of Onset  . Arrhythmia Father        atrial flutter  . Heart failure Father        53  . Parkinson's disease Father        since 69s  . Other Mother        46 years old healthy  . Cancer Maternal Grandfather        lung  . Bipolar disorder Sister        full  . Healthy Brother        full  . Healthy Brother        half brother- neck and back issues  . Healthy Brother        half brother  . Colon cancer Neg Hx   . Colon polyps Neg Hx   . Esophageal cancer Neg Hx   . Rectal cancer Neg Hx   .  Stomach cancer Neg Hx     Social History Social History   Tobacco Use  . Smoking status: Former Smoker    Packs/day: 0.75    Years: 10.00    Pack years: 7.50    Types: Cigarettes    Last attempt to quit: 12/08/1988    Years since quitting: 30.0  . Smokeless tobacco: Former Network engineer Use Topics  . Alcohol use: No    Alcohol/week: 0.0 standard drinks  . Drug use: No     Allergies   Patient has no known allergies.   Review of Systems Review of Systems  Constitutional: Negative for activity change and fever.       All ROS Neg except as noted in HPI  HENT: Negative for nosebleeds.   Eyes: Negative for photophobia and discharge.  Respiratory: Negative for cough, shortness of breath and wheezing.   Cardiovascular: Negative for chest pain and palpitations.  Gastrointestinal: Negative for abdominal pain and blood in stool.  Genitourinary: Negative for dysuria, frequency and hematuria.  Musculoskeletal: Negative for arthralgias, back pain and  neck pain.  Skin: Negative.   Neurological: Negative for dizziness, focal weakness, seizures and speech difficulty.  Psychiatric/Behavioral: Negative for confusion and hallucinations.     Physical Exam Updated Vital Signs BP 118/81 (BP Location: Right Arm)   Pulse 83   Temp 98 F (36.7 C) (Oral)   Resp 18   Ht 5\' 11"  (1.803 m)   Wt 126 kg   SpO2 95%   BMI 38.74 kg/m   Physical Exam Vitals signs and nursing note reviewed.  Constitutional:      Appearance: He is well-developed. He is not toxic-appearing.  HENT:     Head: Normocephalic.     Right Ear: Tympanic membrane and external ear normal.     Left Ear: Tympanic membrane and external ear normal.  Eyes:     General: Lids are normal.     Pupils: Pupils are equal, round, and reactive to light.  Neck:     Musculoskeletal: Normal range of motion and neck supple.     Vascular: No carotid bruit.  Cardiovascular:     Rate and Rhythm: Normal rate and regular rhythm.     Pulses: Normal pulses.     Heart sounds: Normal heart sounds.  Pulmonary:     Effort: No respiratory distress.     Breath sounds: Normal breath sounds.  Abdominal:     General: Bowel sounds are normal.     Palpations: Abdomen is soft.     Tenderness: There is no abdominal tenderness. There is no guarding.  Musculoskeletal: Normal range of motion.     Right hand: He exhibits laceration. He exhibits normal range of motion and normal capillary refill. Normal sensation noted. Normal strength noted.       Hands:  Lymphadenopathy:     Head:     Right side of head: No submandibular adenopathy.     Left side of head: No submandibular adenopathy.     Cervical: No cervical adenopathy.  Skin:    General: Skin is warm and dry.  Neurological:     Mental Status: He is alert and oriented to person, place, and time.     Cranial Nerves: No cranial nerve deficit.     Sensory: No sensory deficit.  Psychiatric:        Speech: Speech normal.      ED Treatments /  Results  Labs (all labs ordered are listed, but only abnormal  results are displayed) Labs Reviewed - No data to display  EKG None  Radiology No results found.  Procedures .Marland KitchenLaceration Repair Date/Time: 12/22/2018 12:39 PM Performed by: Lily Kocher, PA-C Authorized by: Lily Kocher, PA-C   Consent:    Consent obtained:  Verbal   Consent given by:  Patient   Risks discussed:  Infection, pain, poor cosmetic result and poor wound healing Universal protocol:    Procedure explained and questions answered to patient or proxy's satisfaction: yes     Immediately prior to procedure, a time out was called: yes     Patient identity confirmed:  Arm band Anesthesia (see MAR for exact dosages):    Anesthesia method:  Local infiltration   Local anesthetic:  Lidocaine 1% w/o epi Laceration details:    Location:  Hand   Hand location:  R hand, dorsum   Length (cm):  3.2 Repair type:    Repair type:  Simple Pre-procedure details:    Preparation:  Patient was prepped and draped in usual sterile fashion Exploration:    Wound exploration: wound explored through full range of motion     Wound extent: no nerve damage noted, no tendon damage noted and no underlying fracture noted   Treatment:    Area cleansed with:  Betadine   Amount of cleaning:  Standard   Irrigation solution:  Sterile saline Skin repair:    Repair method:  Sutures   Suture size:  4-0   Wound skin closure material used: vicryl rapide.   Suture technique:  Simple interrupted   Number of sutures:  6 Approximation:    Approximation:  Close Post-procedure details:    Dressing:  Sterile dressing   Patient tolerance of procedure:  Tolerated well, no immediate complications   (including critical care time)  Medications Ordered in ED Medications  povidone-iodine (BETADINE) 10 % external solution (has no administration in time range)  lidocaine (PF) (XYLOCAINE) 1 % injection (has no administration in time range)      Initial Impression / Assessment and Plan / ED Course  I have reviewed the triage vital signs and the nursing notes.  Pertinent labs & imaging results that were available during my care of the patient were reviewed by me and considered in my medical decision making (see chart for details).       Final Clinical Impressions(s) / ED Diagnoses MDM  Patient sustained a laceration to the left hand in the webspace between the thumb and the index finger.  No neurovascular deficits appreciated.  The wound was repaired withVicryl-Rapide sutures.  The tetanus was updated.  Patient was given instructions to return if any signs of advancing infection.   Final diagnoses:  Laceration of right hand without foreign body, initial encounter    ED Discharge Orders    None       Lily Kocher, PA-C 12/24/18 0830    Sherwood Gambler, MD 12/24/18 [phone]

## 2018-12-22 NOTE — ED Notes (Signed)
Cleaned with sureclense and applied 4x4's

## 2018-12-22 NOTE — Discharge Instructions (Addendum)
Your wound was repaired with a dissolvable stitch.  This should come out in about 7 to 10 days.  Please see your primary physician or return to the emergency department if any red streaks going up the arm, pus like drainage from between the stitches, fever that will not respond to Tylenol, changes in your condition, problems, or concerns.  Please change the dressing daily.  Please keep the wound clean and dry.  Use a bag with a twisty tie or rubber band if you are in the shower.  Please do not submerge your hand in water or other substances.

## 2018-12-22 NOTE — ED Triage Notes (Signed)
Cut between lt thumb and index finger with a knife attempting to open a package.  Bleeding controlled

## 2019-01-18 ENCOUNTER — Other Ambulatory Visit (INDEPENDENT_AMBULATORY_CARE_PROVIDER_SITE_OTHER): Payer: PRIVATE HEALTH INSURANCE

## 2019-01-18 DIAGNOSIS — B351 Tinea unguium: Secondary | ICD-10-CM

## 2019-01-18 LAB — HEPATIC FUNCTION PANEL
ALT: 21 U/L (ref 0–53)
AST: 16 U/L (ref 0–37)
Albumin: 4.2 g/dL (ref 3.5–5.2)
Alkaline Phosphatase: 55 U/L (ref 39–117)
Bilirubin, Direct: 0.1 mg/dL (ref 0.0–0.3)
Total Bilirubin: 0.4 mg/dL (ref 0.2–1.2)
Total Protein: 6.6 g/dL (ref 6.0–8.3)

## 2019-02-08 ENCOUNTER — Ambulatory Visit: Payer: BLUE CROSS/BLUE SHIELD

## 2019-03-04 ENCOUNTER — Other Ambulatory Visit: Payer: Self-pay | Admitting: Family Medicine

## 2019-03-04 NOTE — Telephone Encounter (Signed)
Typically we recommend 90 days max for this type medication-can you please set patient up for WebEx for further discussion.  Would also need to consider lab monitoring if we refill this.

## 2019-03-07 ENCOUNTER — Other Ambulatory Visit: Payer: Self-pay | Admitting: Family Medicine

## 2019-03-08 ENCOUNTER — Telehealth: Payer: Self-pay | Admitting: Family Medicine

## 2019-03-08 NOTE — Telephone Encounter (Signed)
Copied from [place] [phone]. Topic: General - Inquiry >> Mar 08, 2019  8:29 AM Jasper Loser, CMA wrote: Reason for CRM:  You Just now (8:29 AM) Called pt and left VM to call the office.    Documentation  Marin Olp, MD  [facility] Housekeeper 4 days ago Typically we recommend 90 days max for this type medication-can you please set patient up for WebEx for further discussion.  Would also need to consider lab monitoring if we refill this.   Documentation  Zellmer, Clearnce Sorrel, CMA routed conversation to Marin Olp, MD 4 days ago   [name]  Patient Medication Renewal Request Pool 4 days ago ISAYAH IGNASIAK would like a refill of the following medications:    terbinafine (LAMISIL) 250 MG tablet Garret Reddish, MD]   Preferred pharmacy: [facility] [place], [place]  [id number] >> Mar 08, 2019 11:47 AM Scherrie Gerlach wrote: Pt states [place]. Pt states he can do a phone call.  Pt also has his 2nd shingrix 4/7, does he need to come for that?  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

## 2019-03-08 NOTE — Telephone Encounter (Signed)
After speaking with Dr. Yong Channel about this message, he informed me to cancel the nurse visit for Shingrix injection on 03/15/19.  Pt has an OV scheduled on 04/11/19, which he will receive the injection at that time. I informed ladies at front desk to cancel pt's nurse

## 2019-03-08 NOTE — Telephone Encounter (Signed)
Called pt and left VM to call the office.  

## 2019-03-09 ENCOUNTER — Other Ambulatory Visit: Payer: Self-pay | Admitting: Family Medicine

## 2019-03-09 NOTE — Telephone Encounter (Signed)
Pt is scheduled to come in for OV on 04/11/19.

## 2019-03-09 NOTE — Telephone Encounter (Signed)
See note

## 2019-03-09 NOTE — Telephone Encounter (Signed)
Pt called back and left VM on general mailbox- he was just returning call   Best number  [phone]

## 2019-03-15 ENCOUNTER — Ambulatory Visit: Payer: PRIVATE HEALTH INSURANCE

## 2019-04-11 ENCOUNTER — Encounter: Payer: Self-pay | Admitting: Family Medicine

## 2019-04-11 ENCOUNTER — Other Ambulatory Visit (INDEPENDENT_AMBULATORY_CARE_PROVIDER_SITE_OTHER): Payer: PRIVATE HEALTH INSURANCE

## 2019-04-11 ENCOUNTER — Ambulatory Visit (INDEPENDENT_AMBULATORY_CARE_PROVIDER_SITE_OTHER): Payer: PRIVATE HEALTH INSURANCE | Admitting: Family Medicine

## 2019-04-11 VITALS — Ht 71.0 in

## 2019-04-11 DIAGNOSIS — B351 Tinea unguium: Secondary | ICD-10-CM

## 2019-04-11 DIAGNOSIS — I10 Essential (primary) hypertension: Secondary | ICD-10-CM

## 2019-04-11 DIAGNOSIS — Z23 Encounter for immunization: Secondary | ICD-10-CM | POA: Diagnosis not present

## 2019-04-11 DIAGNOSIS — I5032 Chronic diastolic (congestive) heart failure: Secondary | ICD-10-CM

## 2019-04-11 DIAGNOSIS — Z79899 Other long term (current) drug therapy: Secondary | ICD-10-CM

## 2019-04-11 LAB — COMPREHENSIVE METABOLIC PANEL
ALT: 15 U/L (ref 0–53)
AST: 14 U/L (ref 0–37)
Albumin: 4.3 g/dL (ref 3.5–5.2)
Alkaline Phosphatase: 58 U/L (ref 39–117)
BUN: 15 mg/dL (ref 6–23)
CO2: 25 mEq/L (ref 19–32)
Calcium: 8.9 mg/dL (ref 8.4–10.5)
Chloride: 105 mEq/L (ref 96–112)
Creatinine, Ser: 0.89 mg/dL (ref 0.40–1.50)
GFR: 87.84 mL/min (ref >60.00)
Glucose, Bld: 83 mg/dL (ref 70–99)
Potassium: 3.9 mEq/L (ref 3.5–5.1)
Sodium: 140 mEq/L (ref 135–145)
Total Bilirubin: 0.4 mg/dL (ref 0.2–1.2)
Total Protein: 6.8 g/dL (ref 6.0–8.3)

## 2019-04-11 MED ORDER — FUROSEMIDE 20 MG PO TABS: ORAL_TABLET | ORAL | 11 refills | Status: DC

## 2019-04-11 MED ORDER — TERBINAFINE HCL 250 MG PO TABS: 250.0000 mg | ORAL_TABLET | Freq: Every day | ORAL | 0 refills | Status: DC

## 2019-04-11 MED ORDER — LISINOPRIL 10 MG PO TABS: ORAL_TABLET | ORAL | 11 refills | Status: DC

## 2019-04-11 NOTE — Progress Notes (Signed)
Phone [phone]   Subjective:  Virtual visit via phonenote Chief Complaint  Patient presents with  . Medication Refill    hypertension   This visit type was conducted due to national recommendations for restrictions regarding the COVID-19 Pandemic (e.g. social distancing).  This format is felt to be most appropriate for this patient at this time balancing risks to patient and risks to population by having him in for in person visit.  All issues noted in this document were discussed and addressed.  No physical exam was performed (except for noted visual exam or audio findings with Telehealth visits).  The patient has consented to conduct a Telehealth visit and understands insurance will be billed.   Our team/I connected with Kenneth Berry on 04/11/19 at  8:40 AM EDT by phone (patient did not have equipment for webex) and verified that I am speaking with the correct person using two identifiers.  Location patient: [place] Location provider:  [facility], office Persons participating in the virtual visit:  patient  Time on phone: 15 minutes Counseling provided about covid 19, hypertension  Our team/I discussed the limitations of evaluation and management by telemedicine and the availability of in person appointments. In light of current covid-19 pandemic, patient also understands that we are trying to protect them by minimizing in office contact if at all possible.  The patient expressed consent for telemedicine visit and agreed to proceed. Patient understands insurance will be billed.   ROS- no fever/chills/cough/congestion. No chest pain or new shortness of breath. No headache or blurry vision.    Past Medical History-  Patient Active Problem List   Diagnosis Date Noted  . Diastolic CHF ([place]) 63/12/6008    Priority: High  . Hyperlipidemia 11/27/2017    Priority: Medium  . Low testosterone 11/17/2016    Priority: Medium  . Elevated blood sugar 11/26/2015    Priority: Medium  .  Chronic low back pain 08/15/2014    Priority: Medium  . OSA (obstructive sleep apnea) 09/08/2013    Priority: Medium  . SOB (shortness of breath) on exertion 06/16/2013    Priority: Medium  . Bright red rectal bleeding 06/16/2013    Priority: Medium  . Essential hypertension, benign 03/05/2009    Priority: Medium  . former smoker 7.5 pack years quit 1990 11/17/2016    Priority: Low  . Obesity 11/26/2015    Priority: Low  . 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 furosemide (LASIX) 20 MG tablet TAKE 1 TABLET BY MOUTH ONCE DAILY IN THE MORNING 30 tablet 11  . lisinopril (ZESTRIL) 10 MG tablet Take 1/2 (one-half) tablet by mouth once daily 15 tablet 11  . sildenafil (REVATIO) 20 MG tablet Take 2-5 tablets as needed once every 48 hours for erectile dysfunction 50 tablet 3  . terbinafine (LAMISIL) 250 MG tablet Take 1 tablet (250 mg total) by mouth daily. 90 tablet 0   No current facility-administered medications for this visit.      Objective:  Ht 5\' 11"  (1.803 m)   BMI 38.74 kg/m  self reported vitals - home cuff not working right now [employer], normal speech      Assessment and Plan   #hypertension/chronic diastolic CHF S: controlled on lisinopril 5mg , lasix 20mg  BP Readings from Last 3 Encounters:  12/22/18 118/81  12/07/18 110/76  11/27/17 112/80  A/P: cant check BP at home right now but  has been controlled at past- will try to recheck in 6 weeks.  Suspect controlled based off prior readings-continue current medication  Chronic diastolic heart failure- no reported increased edema on Lasix 20 mg daily.  Continue current medication   #Onychomycosis S:fingers clearing up. Several toes improved but not all of them. He would like to do another 90 days.   A/P: Difficult to fully evaluate without in person visit-patient would really like to trial  another round of Lamisil as he saw improvement but still has some areas down to the base of his nails- I agreed to another 90 days and he agrees to come by within 6 weeks to check liver function.   Other notes: 1.will get labs when he comes by for shingrix shot - needs this before June 30th.  2.  Patient does not have a weight today.  Patient states weight largely stable-has gained some and lost some.  Encouraged need for healthy eating, regular exercise, weight loss.   3.  He will call back for 12/08/2019 physical or later  Lab/Order associations: Essential hypertension, benign  Onychomycosis  High risk medication use - Plan: Comprehensive metabolic panel  Chronic diastolic congestive heart failure ([place])  Meds ordered this encounter  Medications  . furosemide (LASIX) 20 MG tablet    Sig: TAKE 1 TABLET BY MOUTH ONCE DAILY IN THE MORNING    Dispense:  30 tablet    Refill:  11  . lisinopril (ZESTRIL) 10 MG tablet    Sig: Take 1/2 (one-half) tablet by mouth once daily    Dispense:  15 tablet    Refill:  11  . terbinafine (LAMISIL) 250 MG tablet    Sig: Take 1 tablet (250 mg total) by mouth daily.    Dispense:  90 tablet    Refill:  0    Return precautions advised.  Kenneth Reddish, MD

## 2019-04-11 NOTE — Addendum Note (Signed)
Addended by: Gwenyth Ober R on: 04/11/2019 01:55 PM   Modules accepted: Orders

## 2019-04-11 NOTE — Patient Instructions (Addendum)
There are no preventive care reminders to display for this patient.  Depression screen [facility] 2/9 12/07/2018 10/06/2017  Decreased Interest 0 0  Down, Depressed, Hopeless 0 0  PHQ - 2 Score 0 0   Phone visit

## 2019-12-08 ENCOUNTER — Other Ambulatory Visit: Payer: Self-pay

## 2019-12-12 ENCOUNTER — Encounter: Payer: Self-pay | Admitting: Family Medicine

## 2019-12-12 ENCOUNTER — Other Ambulatory Visit: Payer: Self-pay

## 2019-12-12 ENCOUNTER — Ambulatory Visit (INDEPENDENT_AMBULATORY_CARE_PROVIDER_SITE_OTHER): Payer: 59 | Admitting: Family Medicine

## 2019-12-12 VITALS — BP 110/78 | HR 93 | Temp 97.8°F | Ht 70.0 in | Wt 279.0 lb

## 2019-12-12 DIAGNOSIS — E785 Hyperlipidemia, unspecified: Secondary | ICD-10-CM

## 2019-12-12 DIAGNOSIS — Z125 Encounter for screening for malignant neoplasm of prostate: Secondary | ICD-10-CM

## 2019-12-12 DIAGNOSIS — Z87891 Personal history of nicotine dependence: Secondary | ICD-10-CM

## 2019-12-12 DIAGNOSIS — M5441 Lumbago with sciatica, right side: Secondary | ICD-10-CM

## 2019-12-12 DIAGNOSIS — M5442 Lumbago with sciatica, left side: Secondary | ICD-10-CM

## 2019-12-12 DIAGNOSIS — Z0000 Encounter for general adult medical examination without abnormal findings: Secondary | ICD-10-CM | POA: Diagnosis not present

## 2019-12-12 DIAGNOSIS — Z23 Encounter for immunization: Secondary | ICD-10-CM | POA: Diagnosis not present

## 2019-12-12 DIAGNOSIS — G8929 Other chronic pain: Secondary | ICD-10-CM

## 2019-12-12 DIAGNOSIS — R739 Hyperglycemia, unspecified: Secondary | ICD-10-CM | POA: Diagnosis not present

## 2019-12-12 DIAGNOSIS — I10 Essential (primary) hypertension: Secondary | ICD-10-CM

## 2019-12-12 DIAGNOSIS — R7989 Other specified abnormal findings of blood chemistry: Secondary | ICD-10-CM

## 2019-12-12 DIAGNOSIS — I5032 Chronic diastolic (congestive) heart failure: Secondary | ICD-10-CM | POA: Diagnosis not present

## 2019-12-12 LAB — CBC WITH DIFFERENTIAL/PLATELET
Basophils Absolute: 0.1 10*3/uL (ref 0.0–0.1)
Basophils Relative: 0.7 % (ref 0.0–3.0)
Eosinophils Absolute: 0.1 10*3/uL (ref 0.0–0.7)
Eosinophils Relative: 1.3 % (ref 0.0–5.0)
HCT: 45.5 % (ref 39.0–52.0)
Hemoglobin: 15.4 g/dL (ref 13.0–17.0)
Lymphocytes Relative: 18.5 % (ref 12.0–46.0)
Lymphs Abs: 1.7 10*3/uL (ref 0.7–4.0)
MCHC: 33.8 g/dL (ref 30.0–36.0)
MCV: 84.2 fl (ref 78.0–100.0)
Monocytes Absolute: 0.5 10*3/uL (ref 0.1–1.0)
Monocytes Relative: 6.1 % (ref 3.0–12.0)
Neutro Abs: 6.6 10*3/uL (ref 1.4–7.7)
Neutrophils Relative %: 73.4 % (ref 43.0–77.0)
Platelets: 214 10*3/uL (ref 150.0–400.0)
RBC: 5.4 Mil/uL (ref 4.22–5.81)
RDW: 13.8 % (ref 11.5–15.5)
WBC: 9 10*3/uL (ref 4.0–10.5)

## 2019-12-12 LAB — COMPREHENSIVE METABOLIC PANEL
ALT: 20 U/L (ref 0–53)
AST: 16 U/L (ref 0–37)
Albumin: 4.6 g/dL (ref 3.5–5.2)
Alkaline Phosphatase: 62 U/L (ref 39–117)
BUN: 13 mg/dL (ref 6–23)
CO2: 30 mEq/L (ref 19–32)
Calcium: 9.6 mg/dL (ref 8.4–10.5)
Chloride: 102 mEq/L (ref 96–112)
Creatinine, Ser: 0.99 mg/dL (ref 0.40–1.50)
GFR: 77.5 mL/min (ref >60.00)
Glucose, Bld: 120 mg/dL — ABNORMAL HIGH (ref 70–99)
Potassium: 4.4 mEq/L (ref 3.5–5.1)
Sodium: 141 mEq/L (ref 135–145)
Total Bilirubin: 0.6 mg/dL (ref 0.2–1.2)
Total Protein: 7.3 g/dL (ref 6.0–8.3)

## 2019-12-12 LAB — LIPID PANEL
Cholesterol: 183 mg/dL (ref 0–200)
HDL: 32.4 mg/dL — ABNORMAL LOW (ref >39.00)
LDL Cholesterol: 112 mg/dL — ABNORMAL HIGH (ref 0–99)
NonHDL: 150.77
Total CHOL/HDL Ratio: 6
Triglycerides: 192 mg/dL — ABNORMAL HIGH (ref 0.0–149.0)
VLDL: 38.4 mg/dL (ref 0.0–40.0)

## 2019-12-12 LAB — PSA: PSA: 0.74 ng/mL (ref 0.10–4.00)

## 2019-12-12 MED ORDER — SILDENAFIL CITRATE 20 MG PO TABS: ORAL_TABLET | ORAL | 3 refills | Status: DC

## 2019-12-12 MED ORDER — FUROSEMIDE 20 MG PO TABS: ORAL_TABLET | ORAL | 11 refills | Status: DC

## 2019-12-12 MED ORDER — LISINOPRIL 10 MG PO TABS: ORAL_TABLET | ORAL | 11 refills | Status: DC

## 2019-12-12 MED ORDER — TERBINAFINE HCL 250 MG PO TABS: 250.0000 mg | ORAL_TABLET | Freq: Every day | ORAL | 0 refills | Status: DC

## 2019-12-12 NOTE — Progress Notes (Addendum)
Phone: [phone]   Subjective:  Patient presents today for their annual physical. Chief complaint-noted.   See problem oriented charting- ROS- full  review of systems was completed and negative  except for: low back pain, thoracic back pain  The following were reviewed and entered/updated in epic: Past Medical History:  Diagnosis Date  . Allergy    seasonal spring the worst   . Arthritis    spine,neck  . Hemorrhoid    occasionally bleeds   . Hypertension   . Sleep apnea    wears cpap   Patient Active Problem List   Diagnosis Date Noted  . Diastolic CHF ([place]) [id number]    Priority: High  . Hyperlipidemia 11/27/2017    Priority: Medium  . Low testosterone 11/17/2016    Priority: Medium  . Elevated blood sugar 11/26/2015    Priority: Medium  . Chronic low back pain 08/15/2014    Priority: Medium  . OSA (obstructive sleep apnea) 09/08/2013    Priority: Medium  . SOB (shortness of breath) on exertion 06/16/2013    Priority: Medium  . Bright red rectal bleeding 06/16/2013    Priority: Medium  . Essential hypertension, benign 03/05/2009    Priority: Medium  . former smoker 7.5 pack years quit 1990 11/17/2016    Priority: Low  . Morbid obesity ([place]) 11/26/2015    Priority: Low  . 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 Neck fusion    . TONSILLECTOMY AND ADENOIDECTOMY    . UVULOPALATOPHARYNGOPLASTY      Family History  Problem Relation Age of Onset  . Arrhythmia Father        atrial flutter  . Heart failure Father        37  . Parkinson's disease Father        since 34s  . Other Mother        36 years old healthy  . Cancer Maternal Grandfather        lung  . Bipolar disorder Sister        full  . Healthy Brother        full  . Healthy Brother        half brother- neck and back issues  . Healthy Brother        half brother   . Colon cancer Neg Hx   . Colon polyps Neg Hx   . Esophageal cancer Neg Hx   . Rectal cancer Neg Hx   . Stomach cancer Neg Hx     Medications- reviewed and updated Current Outpatient Medications  Medication Sig Dispense Refill  . furosemide (LASIX) 20 MG tablet TAKE 1 TABLET BY MOUTH ONCE DAILY IN THE MORNING 30 tablet 11  . lisinopril (ZESTRIL) 10 MG tablet Take 1/2 (one-half) tablet by mouth once daily 15 tablet 11  . sildenafil (REVATIO) 20 MG tablet Take 2-5 tablets as needed once every 48 hours for erectile dysfunction 50 tablet 3  . terbinafine (LAMISIL) 250 MG tablet Take 1 tablet (250 mg total) by mouth daily. 45 tablet 0   No current facility-administered medications for this visit.    Allergies-reviewed and updated No Known Allergies  Social History   Social History Narrative   Married. 2 adopted children ( adopted son at 3.5 months, daughter- sibling to the son). 18 son interested in music and 15 in 2019.  [employer]- produce      Hobbies: video games  (CPU- plays skyrim as of 2017)      Thinking about biking- no regular exercise   Objective  Objective:  BP 110/78 (BP Location: Left Arm, Patient Position: Sitting, Cuff Size: Large)   Pulse 93   Temp 97.8 F (36.6 C) (Temporal)   Ht 5\' 10"  (1.778 m)   Wt 279 lb (126.6 kg)   SpO2 97%   BMI 40.03 kg/m  Gen: NAD, resting comfortably HEENT: Mucous membranes are moist. Oropharynx normal Neck: no thyromegaly CV: RRR no murmurs rubs or gallops Lungs: CTAB no crackles, wheeze, rhonchi Abdomen: soft/nontender/nondistended/normal bowel sounds. No rebound or guarding.  Ext: no edema Skin: warm, dry Neuro: grossly normal, moves all extremities, PERRLA Rectal: normal tone, normal sized prostate, no masses or tenderness   Assessment and Plan  59 y.o. male presenting for annual physical.  Health Maintenance counseling: 1. Anticipatory guidance: Patient counseled regarding regular dental exams -q6 months, eye  exams -yearly,  avoiding smoking and second hand smoke , limiting alcohol to 2 beverages per day - doesn't drink .   2. Risk factor reduction:  Advised patient of need for regular exercise and diet rich and fruits and vegetables to reduce risk of heart attack and stroke. Exercise- working on the farm regularly. Diet-had lost 30 lbs and regained with covid- knows he needs to cut down on his carb intake and portion size- history of being hungry in the past and thinks this wears on him psychologically.  Morbid obesity status noted with BMI over 40-weight is at least stable from last physical Wt Readings from Last 3 Encounters:  12/12/19 279 lb (126.6 kg)  12/22/18 277 lb 12.5 oz (126 kg)  12/07/18 279 lb 12.8 oz (126.9 kg)  3. Immunizations/screenings/ancillary studies-flu shot today  Immunization History  Administered Date(s) Administered  . Influenza,inj,Quad PF,6+ Mos 08/15/2014, 11/26/2015, 11/17/2016, 10/06/2017, 12/07/2018  . Rabies, IM 05/03/2016, 05/06/2016, 05/10/2016, 05/17/2016  . Td 12/08/2001  . Tdap 08/15/2014  . Zoster Recombinat (Shingrix) 12/07/2018, 04/11/2019  4. Prostate cancer screening- low risk PSA trend.  Update PSA today.  Slight asymmetry on the left previously noted-today- normal exam- discontinue rectal checks as long as psa trend reassuring  Lab Results  Component Value Date   PSA 0.52 12/07/2018   PSA 0.52 11/27/2017   PSA 0.52 08/12/2016   5. Colon cancer screening - number 07/27/2016 colonoscopy with 10-year repeat planned.  Gets occasional hemorrhoid bleeding but was getting that at the time of colonoscopy- he does not want to do banding  6. Skin cancer screening-no dermatologist. advised regular sunscreen use. Denies worrisome, changing, or new skin lesions.  7. former smoker 7.5 pack years quit 1990-AAA screening planned at age 17.  Not a candidate for lung cancer screening  Status of chronic or acute concerns   Chronic diastolic congestive heart failure  (HCC)-appears euvolemic on Lasix. Feels better overall. SOB in past without lasix though never hospitalized- could possibly label as diastolic dysfunction instead  Essential hypertension-excellent control on Lasix 20 mg and lisinopril 5 mg-continue current medications  Hyperlipidemia, unspecified hyperlipidemia type-mild elevations on last check.  Update lipid panel today.  Discussed newer guidelines based on ASCVD risk and 7.5% threshold - he was 7.9% using last years #s- he would like to work on weight loss. No CAD history.  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 Had been "kicking his butt" for 6 months but now beter- had eben getting tingling feeling in left arm.  He recently has been having some low back pain on and off- states has been ongoing issue. He declines referral for now.   Onychomycosis-cleared with 180 days of Lamisil- seems to be coming back on right 2nd finger. Wants to redo 6 weeks   Low libido- wants to recheck testosterone- had been slightly lo win past.    Recommended follow up: Return in about 6 months (around 06/10/2020) for follow up- or sooner if needed.  Lab/Order associations: fasting   ICD-10-CM   1. Preventative health care  Z00.00 CBC with Differential/Platelet    Comprehensive metabolic panel    Lipid panel    PSA    Testos,Total,Free and SHBG (per lab 06/17/18)  2. Chronic  diastolic congestive heart failure (HCC)  I50.32   3. Hyperlipidemia, unspecified hyperlipidemia type  E78.5 CBC with Differential/Platelet    Comprehensive metabolic panel    Lipid panel  4. Elevated blood sugar  R73.9   5. Chronic bilateral low back pain with bilateral sciatica  M54.42    M54.41    G89.29   6. Essential hypertension, benign  I10   7. former smoker 7.5 pack years quit 1990  Z87.891   8. Morbid obesity ([place])  E66.01   9. Low testosterone  R79.89 Testos,Total,Free and SHBG (per lab 06/17/18)  10. Screening for prostate cancer  Z12.5 PSA    Meds ordered this encounter  Medications  . terbinafine (LAMISIL) 250 MG tablet    Sig: Take 1 tablet (250 mg total) by mouth daily.    Dispense:  45 tablet    Refill:  0  . sildenafil (REVATIO) 20 MG tablet    Sig: Take 2-5 tablets as needed once every 48 hours for erectile dysfunction    Dispense:  50 tablet    Refill:  3  . furosemide (LASIX) 20 MG tablet    Sig: TAKE 1 TABLET BY MOUTH ONCE DAILY IN THE MORNING    Dispense:  30 tablet    Refill:  11  . lisinopril (ZESTRIL) 10 MG tablet    Sig: Take 1/2 (one-half) tablet by mouth once daily    Dispense:  15 tablet    Refill:  11    Return precautions advised.  Garret Reddish, MD

## 2019-12-12 NOTE — Patient Instructions (Addendum)
Health Maintenance Due  Topic Date Due  . INFLUENZA VACCINE - today 07/09/2019   Lets set a goal of at least 10 lbs off by 6 month follow up- consider cutting down slowly on portion size perhaps 10% decrease every few weeks. Try to avoid seconds  Let us know if you want to see our sports medicine doctor if low back bothers you more  Recommended follow up: Return in about 6 months (around 06/10/2020) for follow up- or sooner if needed.

## 2019-12-17 LAB — TESTOS,TOTAL,FREE AND SHBG (FEMALE)
Free Testosterone: 39.8 pg/mL (ref 35.0–155.0)
Sex Hormone Binding: 21 nmol/L — ABNORMAL LOW (ref 22–77)
Testosterone, Total, LC-MS-MS: 255 ng/dL (ref 250–1100)

## 2020-02-01 ENCOUNTER — Ambulatory Visit: Payer: 59 | Attending: Internal Medicine

## 2020-02-01 ENCOUNTER — Other Ambulatory Visit: Payer: Self-pay

## 2020-02-01 DIAGNOSIS — Z20822 Contact with and (suspected) exposure to covid-19: Secondary | ICD-10-CM

## 2020-02-02 LAB — NOVEL CORONAVIRUS, NAA: SARS-CoV-2, NAA: NOT DETECTED

## 2020-06-08 NOTE — Progress Notes (Deleted)
  Phone [phone] In person visit   Subjective:   Kenneth Berry is a 59 y.o. year old very pleasant male patient who presents for/with See problem oriented charting No chief complaint on file.   This visit occurred during the SARS-CoV-2 public health emergency.  Safety protocols were in place, including screening questions prior to the visit, additional usage of staff PPE, and extensive cleaning of exam room while observing appropriate contact time as indicated for disinfecting solutions.   Past Medical History-  Patient Active Problem List   Diagnosis Date Noted  . Hyperlipidemia 11/27/2017  . Diastolic CHF ([place]) 33/29/5188  . Low testosterone 11/17/2016  . former smoker 7.5 pack years quit 1990 11/17/2016  . Morbid obesity ([place]) 11/26/2015  . Elevated blood sugar 11/26/2015  . Chronic low back pain 08/15/2014  . OSA (obstructive sleep apnea) 09/08/2013  . SOB (shortness of breath) on exertion 06/16/2013  . Bright red rectal bleeding 06/16/2013  . 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 Essential hypertension, benign 03/05/2009    Medications- reviewed and updated Current Outpatient Medications  Medication Sig Dispense Refill  . furosemide (LASIX) 20 MG tablet TAKE 1 TABLET BY MOUTH ONCE DAILY IN THE MORNING 30 tablet 11  . lisinopril (ZESTRIL) 10 MG tablet Take 1/2 (one-half) tablet by mouth once daily 15 tablet 11  . sildenafil (REVATIO) 20 MG tablet Take 2-5 tablets as needed once every 48 hours for erectile dysfunction 50 tablet 3  . terbinafine (LAMISIL) 250 MG tablet Take 1 tablet (250 mg total) by mouth daily. 45 tablet 0   No current facility-administered medications for this visit.     Objective:  There were no vitals taken for this visit. Gen: NAD, resting comfortably CV: RRR no murmurs rubs or gallops Lungs: CTAB no crackles, wheeze, rhonchi Abdomen: soft/nontender/nondistended/normal bowel sounds. No rebound or guarding.  Ext: no edema Skin: warm,  dry Neuro: grossly normal, moves all extremities  ***    Assessment and Plan   #hyperlipidemia S: Medication:***  Lab Results  Component Value Date   CHOL 183 12/12/2019   HDL 32.40 (L) 12/12/2019   LDLCALC 112 (H) 12/12/2019   LDLDIRECT 86.0 08/12/2016   TRIG 192.0 (H) 12/12/2019   CHOLHDL 6 12/12/2019   A/P: ***  #hypertension S: medication: *** Home readings #s: *** BP Readings from Last 3 Encounters:  12/12/19 110/78  12/22/18 118/81  12/07/18 110/76  A/P: ***   No specialty comments available.  No problem-specific Assessment & Plan notes found for this encounter.   Recommended follow up: ***No follow-ups on file. Future Appointments  Date Time Provider [place]  06/12/2020  8:40 AM Marin Olp, MD [facility]    Lab/Order associations: No diagnosis found.  No orders of the defined types were placed in this encounter.   Time Spent: *** minutes of total time (10:46 AM***- 10:46 AM***) was spent on the date of the encounter performing the following actions: chart review prior to seeing the patient, obtaining history, performing a medically necessary exam, counseling on the treatment plan, placing orders, and documenting in our EHR.   Return precautions advised.  Clyde Lundborg, CMA

## 2020-06-12 ENCOUNTER — Ambulatory Visit: Payer: 59 | Admitting: Family Medicine

## 2020-08-15 NOTE — Progress Notes (Deleted)
  Phone [phone] In person visit   Subjective:   Kenneth Berry is a 59 y.o. year old very pleasant male patient who presents for/with See problem oriented charting No chief complaint on file.   This visit occurred during the SARS-CoV-2 public health emergency.  Safety protocols were in place, including screening questions prior to the visit, additional usage of staff PPE, and extensive cleaning of exam room while observing appropriate contact time as indicated for disinfecting solutions.   Past Medical History-  Patient Active Problem List   Diagnosis Date Noted  . Hyperlipidemia 11/27/2017  . Diastolic CHF ([place]) 41/32/4401  . Low testosterone 11/17/2016  . former smoker 7.5 pack years quit 1990 11/17/2016  . Morbid obesity ([place]) 11/26/2015  . Elevated blood sugar 11/26/2015  . Chronic low back pain 08/15/2014  . OSA (obstructive sleep apnea) 09/08/2013  . SOB (shortness of breath) on exertion 06/16/2013  . Bright red rectal bleeding 06/16/2013  . 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 Essential hypertension, benign 03/05/2009    Medications- reviewed and updated Current Outpatient Medications  Medication Sig Dispense Refill  . furosemide (LASIX) 20 MG tablet TAKE 1 TABLET BY MOUTH ONCE DAILY IN THE MORNING 30 tablet 11  . lisinopril (ZESTRIL) 10 MG tablet Take 1/2 (one-half) tablet by mouth once daily 15 tablet 11  . sildenafil (REVATIO) 20 MG tablet Take 2-5 tablets as needed once every 48 hours for erectile dysfunction 50 tablet 3  . terbinafine (LAMISIL) 250 MG tablet Take 1 tablet (250 mg total) by mouth daily. 45 tablet 0   No current facility-administered medications for this visit.     Objective:  There were no vitals taken for this visit. Gen: NAD, resting comfortably CV: RRR no murmurs rubs or gallops Lungs: CTAB no crackles, wheeze, rhonchi Abdomen: soft/nontender/nondistended/normal bowel sounds. No rebound or guarding.  Ext: no edema Skin: warm,  dry Neuro: grossly normal, moves all extremities  ***    Assessment and Plan   #hypertension S: medication: lisinopril 10Mg , Lasix 20Mg  Home readings #s: *** BP Readings from Last 3 Encounters:  12/12/19 110/78  12/22/18 118/81  12/07/18 110/76  A/P: ***  #hyperlipidemia S: Medication:***  Lab Results  Component Value Date   CHOL 183 12/12/2019   HDL 32.40 (L) 12/12/2019   LDLCALC 112 (H) 12/12/2019   LDLDIRECT 86.0 08/12/2016   TRIG 192.0 (H) 12/12/2019   CHOLHDL 6 12/12/2019   A/P: ***   No specialty comments available.  No problem-specific Assessment & Plan notes found for this encounter.   Recommended follow up: ***No follow-ups on file. Future Appointments  Date Time Provider [place]  08/17/2020  8:00 AM Marin Olp, MD [facility]    Lab/Order associations: No diagnosis found.  No orders of the defined types were placed in this encounter.   Time Spent: *** minutes of total time (8:45 AM***- 8:45 AM***) was spent on the date of the encounter performing the following actions: chart review prior to seeing the patient, obtaining history, performing a medically necessary exam, counseling on the treatment plan, placing orders, and documenting in our EHR.   Return precautions advised.  Clyde Lundborg, CMA

## 2020-08-17 ENCOUNTER — Ambulatory Visit: Payer: 59 | Admitting: Family Medicine

## 2020-09-03 NOTE — Progress Notes (Signed)
Phone [phone] In person visit   Subjective:   Kenneth Berry is a 59 y.o. year old very pleasant male patient who presents for/with See problem oriented charting Chief Complaint  Patient presents with   Hyperlipidemia   Hypertension   This visit occurred during the SARS-CoV-2 public health emergency.  Safety protocols were in place, including screening questions prior to the visit, additional usage of staff PPE, and extensive cleaning of exam room while observing appropriate contact time as indicated for disinfecting solutions.   Past Medical History-  Patient Active Problem List   Diagnosis Date Noted   Diastolic CHF ([place]) 85/46/2703    Priority: High   Hyperlipidemia 11/27/2017    Priority: Medium   Low testosterone 11/17/2016    Priority: Medium   Elevated blood sugar 11/26/2015    Priority: Medium   Chronic low back pain 08/15/2014    Priority: Medium   OSA (obstructive sleep apnea) 09/08/2013    Priority: Medium   SOB (shortness of breath) on exertion 06/16/2013    Priority: Medium   Bright red rectal bleeding 06/16/2013    Priority: Medium   Essential hypertension, benign 03/05/2009    Priority: Medium   former smoker 7.5 pack years quit 1990 11/17/2016    Priority: Low   Morbid obesity ([place]) 11/26/2015    Priority: Low   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     Objective:  BP 128/78    Pulse 92    Temp 98.8 F (37.1 C) (Temporal)    Ht 5\' 10"   (1.778 m)    Wt 270 lb 6.4 oz (122.7 kg)    SpO2 95%    BMI 38.80 kg/m  Gen: NAD, resting comfortably CV: RRR no murmurs rubs or gallops Lungs: CTAB no crackles, wheeze, rhonchi Ext: no edema Skin: warm, dry    Assessment and Plan   #flu shot today  # Head cold S: COVID-19 possible exposure early September.  On September 6 patient developed symptoms including head congestion.  He had a temperature up to 100/100.5 maximum.  He self isolated.  No chest issues/no increased shortness of breath above baseline.  1-1/2 weeks ago the symptoms resolved and he has had no symptoms since that time.  A/P: Even if patient had COVID-19 he would be outside of quarantine.  And no longer infectious at this point as it has been over 20 days and he was never hospitalized.  No testing is required at this time. -has had lingering fatigue above his normal amount since this time- very well may have had covid and this is long hauler syndrome - discussed possible vitamin testing but may not be covered by insurance- he wants to continue vitamin D and may add vitamin b12  #hyperlipidemia S: Medication:None Lab Results  Component Value Date   CHOL 183 12/12/2019   HDL 32.40 (L) 12/12/2019   LDLCALC 112 (H) 12/12/2019   LDLDIRECT 86.0 08/12/2016   TRIG 192.0 (H) 12/12/2019   CHOLHDL 6  12/12/2019   A/P: 10-year ASCVD risk slightly elevated 11.7%-patient is working on healthy eating/regular exercise to try to bring down his risk-recalculated physical.  We did discuss coronary calcium scoring-he is going to read about this and will consider if cholesterol numbers remain above 7.5% at follow-up.  A healthy plant-based low in processed food diet is ideal for lowering cholesterol-vegetarian and vegan diets in particular can really help lower levels  #Diastolic CHF-cardiology has thought most of his shortness of breath is related to deconditioning.  Has also seen pulmonology in the past #hypertension S: medication:  Lisinopril 10Mg , Lasix 20 mg  Shortness of breath stable at baseline. No increased edema. No recent weight gain.  BP Readings from Last 3 Encounters:  09/04/20 128/78  12/12/19 110/78  12/22/18 118/81  A/P: Patient appears euvolemic-continue Lasix 20 mg daily  For hypertension-well-controlled today-continue current medications  # Hyperglycemia/insulin resistance/prediabetes S:  Medication: none Exercise and diet- congratulated patient on 9 pound weight loss! States actually had lost 20 lbs but has regained some. Had good success with changing diet Lab Results  Component Value Date   HGBA1C 5.5 12/07/2018   HGBA1C 5.6 11/27/2017   HGBA1C 5.6 08/12/2016    A/P: Congratulated patient on weight loss.  Encouraged him to continue his efforts/restart efforts after recent setback-see above.  We will check an A1c with blood work today    #Chronic bilateral low back pain-known herniated disc and bone thoracic spine and has seen orthopedics in the past. Intermittent issues with pain Has not had success with chiropractor or physical therapy.   Last visit declined referral- declines referral back.    #Onychomycosis-cleared with extended Lamisil course but seem to be recurrent and right second finger and retrial 6 weeks- states  Has done well  Recommended follow up: No follow-ups on file.   Lab/Order associations:   ICD-10-CM   1. Essential hypertension, benign  I10   2. Hyperlipidemia, unspecified hyperlipidemia type  E78.5     No orders of the defined types were placed in this encounter.    Return precautions advised.  Garret Reddish, MD

## 2020-09-03 NOTE — Patient Instructions (Addendum)
Health Maintenance Due  Topic Date Due  . INFLUENZA VACCINE flu shot today 07/08/2020   We should have your COVID-19 results back hopefully within 2 days.  I want you to quarantine until you get a negative result  If you develop shortness of breath above baseline please seek care immediately/call 911  No changes in medication today unless labs lead [place] to make changes-we will need to reschedule labs once we have a negative Covid test  Consider coronary calcium scoring if cholesterol remains high at next visit physical   discussed possible vitamin testing but may not be covered by insurance- he wants to continue vitamin D and may add vitamin b12  Please stop by lab before you go If you have mychart- we will send your results within 3 business days of [place] receiving them.  If you do not have mychart- we will call you about results within 5 business days of [place] receiving them.  *please note we are currently using [facility] which has a longer processing time than [facility] typically so labs may not come back as quickly as in the past *please also note that you will see labs on mychart as soon as they post. I will later go in and write notes on them- will say "notes from Dr. Yong Channel"

## 2020-09-04 ENCOUNTER — Other Ambulatory Visit: Payer: Self-pay

## 2020-09-04 ENCOUNTER — Encounter: Payer: Self-pay | Admitting: Family Medicine

## 2020-09-04 ENCOUNTER — Ambulatory Visit (INDEPENDENT_AMBULATORY_CARE_PROVIDER_SITE_OTHER): Payer: 59 | Admitting: Family Medicine

## 2020-09-04 VITALS — BP 128/78 | HR 92 | Temp 98.8°F | Ht 70.0 in | Wt 270.4 lb

## 2020-09-04 DIAGNOSIS — E785 Hyperlipidemia, unspecified: Secondary | ICD-10-CM

## 2020-09-04 DIAGNOSIS — R5383 Other fatigue: Secondary | ICD-10-CM | POA: Diagnosis not present

## 2020-09-04 DIAGNOSIS — Z1152 Encounter for screening for COVID-19: Secondary | ICD-10-CM | POA: Diagnosis not present

## 2020-09-04 DIAGNOSIS — Z23 Encounter for immunization: Secondary | ICD-10-CM

## 2020-09-04 DIAGNOSIS — R739 Hyperglycemia, unspecified: Secondary | ICD-10-CM

## 2020-09-04 DIAGNOSIS — I10 Essential (primary) hypertension: Secondary | ICD-10-CM | POA: Diagnosis not present

## 2020-09-04 NOTE — Addendum Note (Signed)
Addended by: Thomes Cake on: 09/04/2020 01:59 PM   Modules accepted: Orders

## 2020-09-05 LAB — CBC WITH DIFFERENTIAL/PLATELET
Absolute Monocytes: 626 cells/uL (ref 200–950)
Basophils Absolute: 51 cells/uL (ref 0–200)
Basophils Relative: 0.5 %
Eosinophils Absolute: 131 cells/uL (ref 15–500)
Eosinophils Relative: 1.3 %
HCT: 44.7 % (ref 38.5–50.0)
Hemoglobin: 15.3 g/dL (ref 13.2–17.1)
Lymphs Abs: 2283 cells/uL (ref 850–3900)
MCH: 28.8 pg (ref 27.0–33.0)
MCHC: 34.2 g/dL (ref 32.0–36.0)
MCV: 84 fL (ref 80.0–100.0)
MPV: 8.8 fL (ref 7.5–12.5)
Monocytes Relative: 6.2 %
Neutro Abs: 7009 cells/uL (ref 1500–7800)
Neutrophils Relative %: 69.4 %
Platelets: 203 10*3/uL (ref 140–400)
RBC: 5.32 10*6/uL (ref 4.20–5.80)
RDW: 12.9 % (ref 11.0–15.0)
Total Lymphocyte: 22.6 %
WBC: 10.1 10*3/uL (ref 3.8–10.8)

## 2020-09-05 LAB — COMPLETE METABOLIC PANEL WITH GFR
AG Ratio: 1.7 (calc) (ref 1.0–2.5)
ALT: 20 U/L (ref 9–46)
AST: 15 U/L (ref 10–35)
Albumin: 4.3 g/dL (ref 3.6–5.1)
Alkaline phosphatase (APISO): 56 U/L (ref 35–144)
BUN: 13 mg/dL (ref 7–25)
CO2: 27 mmol/L (ref 20–32)
Calcium: 9.5 mg/dL (ref 8.6–10.3)
Chloride: 105 mmol/L (ref 98–110)
Creat: 0.97 mg/dL (ref 0.70–1.33)
GFR, Est African American: 99 mL/min/{1.73_m2} (ref >=60)
GFR, Est Non African American: 85 mL/min/{1.73_m2} (ref >=60)
Globulin: 2.6 g/dL (calc) (ref 1.9–3.7)
Glucose, Bld: 97 mg/dL (ref 65–99)
Potassium: 3.9 mmol/L (ref 3.5–5.3)
Sodium: 141 mmol/L (ref 135–146)
Total Bilirubin: 0.4 mg/dL (ref 0.2–1.2)
Total Protein: 6.9 g/dL (ref 6.1–8.1)

## 2020-09-05 LAB — HEMOGLOBIN A1C
Hgb A1c MFr Bld: 5.6 % of total Hgb (ref <5.7)
Mean Plasma Glucose: 114 (calc)
eAG (mmol/L): 6.3 (calc)

## 2020-09-05 LAB — TSH: TSH: 2.28 mIU/L (ref 0.40–4.50)

## 2020-11-14 ENCOUNTER — Other Ambulatory Visit: Payer: Self-pay

## 2020-11-14 MED ORDER — LISINOPRIL 10 MG PO TABS: ORAL_TABLET | ORAL | 11 refills | Status: DC

## 2021-01-07 ENCOUNTER — Encounter: Payer: 59 | Admitting: Family Medicine

## 2021-01-28 NOTE — Progress Notes (Signed)
Phone: [phone]   Subjective:  Patient presents today for their annual physical. Chief complaint-noted.   See problem oriented charting- ROS- full  review of systems was completed and negative  except for: back and neck pain with some radiation into left arm and left leg  The following were reviewed and entered/updated in epic: Past Medical History:  Diagnosis Date  . Allergy    seasonal spring the worst   . Arthritis    spine,neck  . Hemorrhoid    occasionally bleeds   . Hypertension   . Sleep apnea    wears cpap   Patient Active Problem List   Diagnosis Date Noted  . Diastolic CHF ([place]) 98/92/1194    Priority: High  . Hyperlipidemia 11/27/2017    Priority: Medium  . Low testosterone 11/17/2016    Priority: Medium  . Elevated blood sugar 11/26/2015    Priority: Medium  . Chronic low back pain 08/15/2014    Priority: Medium  . OSA (obstructive sleep apnea) 09/08/2013    Priority: Medium  . SOB (shortness of breath) on exertion 06/16/2013    Priority: Medium  . Bright red rectal bleeding 06/16/2013    Priority: Medium  . Essential hypertension, benign 03/05/2009    Priority: Medium  . former smoker 7.5 pack years quit 1990 11/17/2016    Priority: Low  . Morbid obesity ([place]) 11/26/2015    Priority: Low  . 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 Neck fusion    . TONSILLECTOMY AND ADENOIDECTOMY    . UVULOPALATOPHARYNGOPLASTY      Family History  Problem Relation Age of Onset  . Arrhythmia Father        atrial flutter  . Heart failure Father        53  . Parkinson's disease Father        since 77s  . Other Mother        57 years old healthy  . Cancer Maternal Grandfather        lung  . Bipolar disorder Sister        full  . Healthy Brother        full  . Healthy Brother        half brother- neck and back issues  .  Healthy Brother        half brother  . Colon cancer Neg Hx   . Colon polyps Neg Hx   . Esophageal cancer Neg Hx   . Rectal cancer Neg Hx   . Stomach cancer Neg Hx     Medications- reviewed and updated Current Outpatient Medications  Medication Sig Dispense Refill  . furosemide (LASIX) 20 MG tablet TAKE 1 TABLET BY MOUTH ONCE DAILY IN THE MORNING 30 tablet 11  . lisinopril (ZESTRIL) 10 MG tablet Take 1/2 (one-half) tablet by mouth once daily 15 tablet 11  . sildenafil (REVATIO) 20 MG tablet Take 2-5 tablets as needed once every 48 hours for erectile dysfunction 50 tablet 3   No current facility-administered medications for this visit.    Allergies-reviewed and updated No Known Allergies  Social History   Social History Narrative   Married. 2 adopted children ( adopted son at 3.5 months, daughter- sibling to the son). 18 son interested in music and 15 in 2019.       [employer]- produce  Hobbies: video games  (CPU- plays skyrim as of 2017)      Thinking about biking- no regular exercise   Objective  Objective:  BP 118/82   Pulse 88   Temp 97.9 F (36.6 C) (Temporal)   Ht 5\' 10"  (1.778 m)   Wt 272 lb 9.6 oz (123.7 kg)   SpO2 95%   BMI 39.11 kg/m  Gen: NAD, resting comfortably HEENT: Mucous membranes are moist. Oropharynx normal Neck: no thyromegaly CV: RRR no murmurs rubs or gallops Lungs: CTAB no crackles, wheeze, rhonchi Abdomen: soft/nontender/nondistended/normal bowel sounds. No rebound or guarding.  Ext: no edema Skin: warm, dry Neuro: grossly normal, moves all extremities, PERRLA    Assessment and Plan  60 y.o. male presenting for annual physical.  Health Maintenance counseling: 1. Anticipatory guidance: Patient counseled regarding regular dental exams -q6 months, eye exams -yearly,  avoiding smoking and second hand smoke , limiting alcohol to 2 beverages per day- doesn't drink .   2. Risk factor reduction:  Advised patient of need for regular exercise and  diet rich and fruits and vegetables to reduce risk of heart attack and stroke. 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 Diet- had significant weight loss of 30 lbs then later regained with covid- in 2021 was able to lose 9 lbs, only up 2 lbs from last visit despite holidays and poor mobility with pain- working on reasonably healthy diet. Morbid obesity with BMI over 35 and HTN/HLD Wt Readings from Last 3 Encounters:  01/29/21 272 lb 9.6 oz (123.7 kg)  09/04/20 270 lb 6.4 oz (122.7 kg)  12/12/19 279 lb (126.6 kg)  3. Immunizations/screenings/ancillary studies- discussed covid 19 booster - he opts out Immunization History  Administered Date(s) Administered  . Influenza,inj,Quad PF,6+ Mos 08/15/2014, 11/26/2015, 11/17/2016, 10/06/2017, 12/07/2018, 12/12/2019, 09/04/2020  . PFIZER(Purple Top)SARS-COV-2 Vaccination 03/01/2020, 03/30/2020  . Rabies, IM 05/03/2016, 05/06/2016, 05/10/2016, 05/17/2016  . Td 12/08/2001  . Tdap 08/15/2014  . Zoster Recombinat (Shingrix) 12/07/2018, 04/11/2019  4. Prostate cancer screening-  low risk psa trend. Has had slight asymmetry on left in past but we have opted to discontinue rectal exams unless significant increase in PSA trend Lab Results  Component Value Date   PSA 0.74 12/12/2019   PSA 0.52 12/07/2018   PSA 0.52 11/27/2017   5. Colon cancer screening -  normal 07/27/16 with 10 year repeat planned. Occasional hemorrhoid bleeding but not interested in banding  6. Skin cancer screening- no dermatologist. advised regular sunscreen use. Denies worrisome, changing, or new skin lesions.  60. Former smoker- quit 1990- AAA screened at 55 8. STD screening -  only active with wife  Status of chronic or acute concerns   #hyperlipidemia S: Medication: none. 10 year ascvd risk as high as 11.7%.  -considering coronary calcium scoring -working on lifestyle changes A/P:  update lipids- he wants to focus on lifestyle for now.   #hypertension #Diastolic CHF- with SOB but cardiology has thought primarily deconditioning. Has also seen pulmonary in the past.  S: medication: lisinopril 10Mg , lasix 20 mg Home readings #s: does not check Shortness of breath: stable Edema: no increase Weight gain: minimal A/P: Stable. Continue current medications.   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 Also history of fusion in cervical spine with Dr. Carloyn Manner   -PT and chiropractor trials not helpful  Patient with worsening pain in recent months with radiation into left leg and pain in neck radiating to left arm A/P: refer to sports medicine for their expert opinion with worsening pain   Recommended follow up: Return in about 6 months (around 07/29/2021) for follow up- or sooner if needed.  Lab/Order associations: NOT fasting- cream in coffee/whoel milk   ICD-10-CM   1. Preventative health care  Z00.00 CBC with Differential/Platelet    Comprehensive metabolic panel    Lipid panel  2. Essential hypertension, benign  I10 CBC with Differential/Platelet    Comprehensive metabolic panel    Lipid panel    POCT Urinalysis Dipstick (Automated)  3. Hyperlipidemia, unspecified hyperlipidemia type  E78.5 CBC with Differential/Platelet    Comprehensive metabolic panel    Lipid panel  4. former smoker 7.5 pack years quit 1990  Z87.891 POCT Urinalysis Dipstick (Automated)  5. Hyperglycemia  R73.9   6. Chronic diastolic congestive heart failure (H[facility]) Chronic I50.32   7. Morbid obesity ([place]) Chronic E66.01   8. Chronic left-sided low back pain with left-sided sciatica  M54.42 Ambulatory referral to Sports Medicine   G89.29   9. Neck pain  M54.2  Ambulatory referral to Sports Medicine    No orders of the defined types were placed in this encounter.   Return precautions advised.  Garret Reddish, MD

## 2021-01-28 NOTE — Patient Instructions (Addendum)
Please stop by lab before you go If you have mychart- we will send your results within 3 business days of [place] receiving them.  If you do not have mychart- we will call you about results within 5 business days of [place] receiving them.  *please also note that you will see labs on mychart as soon as they post. I will later go in and write notes on them- will say "notes from Dr. Yong Channel"  We will call you within two weeks about your referral to sports medicine. If you do not hear within 2 weeks, give [place] a call.   Let's set a goal of 5-10 lbs of weight loss by follow up  Recommended follow up: Return in about 6 months (around 07/29/2021) for follow up- or sooner if needed.

## 2021-01-29 ENCOUNTER — Ambulatory Visit (INDEPENDENT_AMBULATORY_CARE_PROVIDER_SITE_OTHER): Payer: 59 | Admitting: Family Medicine

## 2021-01-29 ENCOUNTER — Encounter: Payer: Self-pay | Admitting: Family Medicine

## 2021-01-29 ENCOUNTER — Other Ambulatory Visit: Payer: Self-pay

## 2021-01-29 VITALS — BP 118/82 | HR 88 | Temp 97.9°F | Ht 70.0 in | Wt 272.6 lb

## 2021-01-29 DIAGNOSIS — R739 Hyperglycemia, unspecified: Secondary | ICD-10-CM

## 2021-01-29 DIAGNOSIS — Z0000 Encounter for general adult medical examination without abnormal findings: Secondary | ICD-10-CM

## 2021-01-29 DIAGNOSIS — G8929 Other chronic pain: Secondary | ICD-10-CM

## 2021-01-29 DIAGNOSIS — E785 Hyperlipidemia, unspecified: Secondary | ICD-10-CM

## 2021-01-29 DIAGNOSIS — I5032 Chronic diastolic (congestive) heart failure: Secondary | ICD-10-CM

## 2021-01-29 DIAGNOSIS — Z87891 Personal history of nicotine dependence: Secondary | ICD-10-CM

## 2021-01-29 DIAGNOSIS — I10 Essential (primary) hypertension: Secondary | ICD-10-CM | POA: Diagnosis not present

## 2021-01-29 DIAGNOSIS — M5442 Lumbago with sciatica, left side: Secondary | ICD-10-CM

## 2021-01-29 DIAGNOSIS — M542 Cervicalgia: Secondary | ICD-10-CM

## 2021-01-29 LAB — POC URINALSYSI DIPSTICK (AUTOMATED)
Bilirubin, UA: NEGATIVE
Blood, UA: NEGATIVE
Glucose, UA: NEGATIVE
Ketones, UA: NEGATIVE
Leukocytes, UA: NEGATIVE
Nitrite, UA: NEGATIVE
Protein, UA: NEGATIVE
Spec Grav, UA: >=1.03 — AB (ref 1.010–1.025)
Urobilinogen, UA: 0.2 E.U./dL
pH, UA: 5.5 (ref 5.0–8.0)

## 2021-01-29 LAB — COMPREHENSIVE METABOLIC PANEL
ALT: 25 U/L (ref 0–53)
AST: 17 U/L (ref 0–37)
Albumin: 4.5 g/dL (ref 3.5–5.2)
Alkaline Phosphatase: 52 U/L (ref 39–117)
BUN: 16 mg/dL (ref 6–23)
CO2: 27 mEq/L (ref 19–32)
Calcium: 9.4 mg/dL (ref 8.4–10.5)
Chloride: 104 mEq/L (ref 96–112)
Creatinine, Ser: 1.04 mg/dL (ref 0.40–1.50)
GFR: 78.51 mL/min (ref >60.00)
Glucose, Bld: 102 mg/dL — ABNORMAL HIGH (ref 70–99)
Potassium: 4.1 mEq/L (ref 3.5–5.1)
Sodium: 140 mEq/L (ref 135–145)
Total Bilirubin: 0.4 mg/dL (ref 0.2–1.2)
Total Protein: 7.6 g/dL (ref 6.0–8.3)

## 2021-01-29 LAB — CBC WITH DIFFERENTIAL/PLATELET
Basophils Absolute: 0.1 10*3/uL (ref 0.0–0.1)
Basophils Relative: 0.6 % (ref 0.0–3.0)
Eosinophils Absolute: 0.2 10*3/uL (ref 0.0–0.7)
Eosinophils Relative: 2 % (ref 0.0–5.0)
HCT: 45 % (ref 39.0–52.0)
Hemoglobin: 15.2 g/dL (ref 13.0–17.0)
Lymphocytes Relative: 18.7 % (ref 12.0–46.0)
Lymphs Abs: 1.7 10*3/uL (ref 0.7–4.0)
MCHC: 33.8 g/dL (ref 30.0–36.0)
MCV: 83.4 fl (ref 78.0–100.0)
Monocytes Absolute: 0.7 10*3/uL (ref 0.1–1.0)
Monocytes Relative: 7.3 % (ref 3.0–12.0)
Neutro Abs: 6.6 10*3/uL (ref 1.4–7.7)
Neutrophils Relative %: 71.4 % (ref 43.0–77.0)
Platelets: 204 10*3/uL (ref 150.0–400.0)
RBC: 5.4 Mil/uL (ref 4.22–5.81)
RDW: 13.6 % (ref 11.5–15.5)
WBC: 9.2 10*3/uL (ref 4.0–10.5)

## 2021-01-29 LAB — LIPID PANEL
Cholesterol: 165 mg/dL (ref 0–200)
HDL: 35.3 mg/dL — ABNORMAL LOW (ref >39.00)
LDL Cholesterol: 110 mg/dL — ABNORMAL HIGH (ref 0–99)
NonHDL: 129.42
Total CHOL/HDL Ratio: 5
Triglycerides: 99 mg/dL (ref 0.0–149.0)
VLDL: 19.8 mg/dL (ref 0.0–40.0)

## 2021-01-29 NOTE — Addendum Note (Signed)
Addended by: Brandy Hale on: 01/29/2021 08:47 AM   Modules accepted: Orders

## 2021-02-04 NOTE — Progress Notes (Signed)
Subjective:    I'm seeing this patient as a consultation for:  Dr. Yong Channel. Note will be routed back to referring provider/PCP.  CC: Low back pain and neck pain  I, Molly Weber, LAT, ATC, am serving as scribe for Dr. Lynne Leader.  HPI: Pt is a 60 y/o male presenting w/ chronic neck and low back pain. He has seen ortho in the past and has a hx of a cervical fusion w/ Dr. Carloyn Manner.  Pt describes pain as constant.  Low back: Chronic pain that he locates to lower L-side. Pt describes pain as "constant." Pt locates pain to L SI joint region that radiates into L buttock/hip and down leg. -Radiating pain: yes, into L LE -LE numbness/tingling: no -Aggravating factors: forward flexing, carrying, lifting, walking on uneven ground -Treatments tried: PT, chiro  Neck pain: Chronic pain that he locates to both sides of c-spine. -Radiating pain: yes into L UE -UE numbness/tingling: yes- down into hand/finger -Aggravating factors: unsure -Treatments tried: PT; chiro  Diagnostic testing: C-spine XR- 06/23/06, 06/14/05 and 10/02/03; C-spine and L-spine MRI- 12/12/03; L-spine XR- 10/02/03  Past medical history, Surgical history, Family history, Social history, Allergies, and medications have been entered into the medical record, reviewed.   Review of Systems: No new headache, visual changes, nausea, vomiting, diarrhea, constipation, dizziness, abdominal pain, skin rash, fevers, chills, night sweats, weight loss, swollen lymph nodes, body aches, joint swelling, muscle aches, chest pain, shortness of breath, mood changes, visual or auditory hallucinations.   Objective:    Vitals:   02/05/21 0930  BP: 117/71  Pulse: 94  SpO2: 97%   General: Well Developed, well nourished, and in no acute distress.  Neuro/Psych: Alert and oriented x3, extra-ocular muscles intact, able to move all 4 extremities, sensation grossly intact. Skin: Warm and dry, no rashes noted.  Respiratory: Not using accessory muscles, speaking  in full sentences, trachea midline.  Cardiovascular: Pulses palpable, no extremity edema. Abdomen: Does not appear distended. MSK:  C-spine normal-appearing Decreased cervical motion. Positive left-sided Spurling's test. Upper extremity strength reflexes and sensation are equal and normal throughout.  L-spine normal-appearing Nontender midline.  Mildly tender palpation left lumbar paraspinal musculature. Degrees lumbar motion. Lower extremity strength reflexes and sensation are intact distally. Mildly positive left-sided slump test.  Lab and Radiology Results  X-ray images C-spine and L-spine obtained today personally and independently interpreted.  C-spine: Fusion C4-5-6-7 intact anterior surgical plate and hardware.    L-spine: Facet DJD L5-S1 with some spondylosis.  No fractures or malalignment.  Await formal radiology review  Impression and Recommendations:    Assessment and Plan: 60 y.o. male with left lumbar back pain with left L5 lumbar radiculopathy.  Will do quite well with physical therapy.  We will also prescribe short course of prednisone and gabapentin.  Left cervical radiculopathy.  Again trial of gabapentin prednisone and physical therapy..  Recheck 6 weeks.  Consider MRI as next steps if not improving.  PDMP not reviewed this encounter. Orders Placed This Encounter  Procedures  . DG Cervical Spine 2 or 3 views    Standing Status:   Future    Number of Occurrences:   1    Standing Expiration Date:   02/05/2022    Order Specific Question:   Reason for Exam (SYMPTOM  OR DIAGNOSIS REQUIRED)    Answer:   eval cervical red left    Order Specific Question:   Preferred imaging location?    Answer:   [facility]  .  DG Lumbar Spine 2-3 Views    Standing Status:   Future    Number of Occurrences:   1    Standing Expiration Date:   02/05/2022    Order Specific Question:   Reason for Exam (SYMPTOM  OR DIAGNOSIS REQUIRED)    Answer:   eval lumbar pain wth left  L5 rad    Order Specific Question:   Preferred imaging location?    Answer:   [facility]  . Ambulatory referral to Physical Therapy    Referral Priority:   Routine    Referral Type:   Physical Medicine    Referral Reason:   Specialty Services Required    Requested Specialty:   Physical Therapy    Number of Visits Requested:   1   Meds ordered this encounter  Medications  . gabapentin (NEURONTIN) 300 MG capsule    Sig: Take 1 capsule (300 mg total) by mouth 3 (three) times daily as needed.    Dispense:  90 capsule    Refill:  3  . predniSONE (DELTASONE) 50 MG tablet    Sig: Take 1 tablet (50 mg total) by mouth daily.    Dispense:  5 tablet    Refill:  0    Discussed warning signs or symptoms. Please see discharge instructions. Patient expresses understanding.   The above documentation has been reviewed and is accurate and complete Lynne Leader, M.D.

## 2021-02-05 ENCOUNTER — Other Ambulatory Visit: Payer: Self-pay

## 2021-02-05 ENCOUNTER — Ambulatory Visit (INDEPENDENT_AMBULATORY_CARE_PROVIDER_SITE_OTHER): Payer: 59 | Admitting: Family Medicine

## 2021-02-05 ENCOUNTER — Ambulatory Visit (INDEPENDENT_AMBULATORY_CARE_PROVIDER_SITE_OTHER): Payer: 59

## 2021-02-05 VITALS — BP 117/71 | HR 94 | Ht 70.0 in | Wt 275.0 lb

## 2021-02-05 DIAGNOSIS — G8929 Other chronic pain: Secondary | ICD-10-CM

## 2021-02-05 DIAGNOSIS — M5412 Radiculopathy, cervical region: Secondary | ICD-10-CM

## 2021-02-05 DIAGNOSIS — M5442 Lumbago with sciatica, left side: Secondary | ICD-10-CM

## 2021-02-05 MED ORDER — PREDNISONE 50 MG PO TABS: 50.0000 mg | ORAL_TABLET | Freq: Every day | ORAL | 0 refills | Status: DC

## 2021-02-05 MED ORDER — GABAPENTIN 300 MG PO CAPS: 300.0000 mg | ORAL_CAPSULE | Freq: Three times a day (TID) | ORAL | 3 refills | Status: DC | PRN

## 2021-02-05 NOTE — Patient Instructions (Addendum)
Thank you for coming in today.  I've referred you to Physical Therapy.  Let us know if you don't hear from them in one week.  Use heating pad and TENS unit.   Try the gabapentin.   Recheck in 6 weeks.

## 2021-02-06 ENCOUNTER — Telehealth: Payer: Self-pay | Admitting: Family Medicine

## 2021-02-06 NOTE — Telephone Encounter (Signed)
[facility] called with a call report from the Cervical Spine Xray-  FINDINGS: Generalized straightening of the cervical spine. Post fusion changes C5 through C7. Potential mild lucency about the fixating screws at C5 and C6. Moderate severe degenerative change at C2-C3. Slightly prominent prevertebral soft tissues measuring up to 7 mm. Dens and lateral masses are within normal limits.  IMPRESSION: 1. Post fusion changes C5 through C7. Potential mild lucency about the fixating screws at C5 and C6, possible loosening. 2. Prevertebral soft tissues are borderline to slightly enlarged; if symptoms suggest soft tissue infection, further evaluation with CT or MRI could be obtained.

## 2021-02-06 NOTE — Progress Notes (Signed)
X-ray lumbar spine shows some arthritis changes but otherwise looks okay to radiology

## 2021-02-06 NOTE — Progress Notes (Signed)
X-ray cervical spine shows fusion present at C5-C7.  Possible loosening of the screws at C5 and C6 although this is not for sure and probably not contributing to your symptoms.  There is some soft tissue swelling present as well.  The radiologist noted that if were concerned about infection that we should proceed to MRI now.  However I am not particularly worried about infection as you do not have significant redness and pain in your neck now.  I would recommend proceeding with plan as we discussed in clinic with physical therapy and gabapentin and if not better then proceeding with MRI.

## 2021-02-26 NOTE — Addendum Note (Signed)
Addended by: Pollyann Glen on: 02/26/2021 01:50 PM   Modules accepted: Orders

## 2021-03-18 ENCOUNTER — Other Ambulatory Visit: Payer: Self-pay

## 2021-03-18 ENCOUNTER — Ambulatory Visit (HOSPITAL_COMMUNITY): Payer: 59 | Attending: Family Medicine

## 2021-03-18 ENCOUNTER — Encounter (HOSPITAL_COMMUNITY): Payer: Self-pay

## 2021-03-18 DIAGNOSIS — M545 Low back pain, unspecified: Secondary | ICD-10-CM | POA: Insufficient documentation

## 2021-03-18 DIAGNOSIS — G8929 Other chronic pain: Secondary | ICD-10-CM | POA: Insufficient documentation

## 2021-03-18 DIAGNOSIS — M6281 Muscle weakness (generalized): Secondary | ICD-10-CM | POA: Insufficient documentation

## 2021-03-18 NOTE — Therapy (Signed)
[place], [place], [address] Phone: [phone]   Fax:  [phone]  Physical Therapy Evaluation  Patient Details  Name: Kenneth Berry MRN: [id number] Date of Birth: Jun 08, 1961 Referring Provider (PT): Lynne Leader, MD   Encounter Date: 03/18/2021   PT End of Session - 03/18/21 0900    Visit Number 1    Number of Visits 6    Date for PT Re-Evaluation 04/29/21    Authorization Type [employer], no auth, 30 VL    Authorization - Visit Number 1    Authorization - Number of Visits 30    Progress Note Due on Visit 6    PT Start Time 0900    PT Stop Time 0945    PT Time Calculation (min) 45 min    Activity Tolerance Patient tolerated treatment well    Behavior During Therapy [facility] for tasks assessed/performed           Past Medical History:  Diagnosis Date  . Allergy    seasonal spring the worst   . Arthritis    spine,neck  . Hemorrhoid    occasionally bleeds   . Hypertension   . Sleep apnea    wears cpap    Past Surgical History:  Procedure Laterality Date  . 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 Neck fusion    . TONSILLECTOMY AND ADENOIDECTOMY    . UVULOPALATOPHARYNGOPLASTY      There were no vitals filed for this visit.    Subjective Assessment - 03/18/21 0903    Subjective Pt reports low back pain and left side pain and notes left hip/buttock discomfort and occasional LLE pain when he is active and straining and reports "years and years of low back pain". Pt denies any recent accident or injury that have been a culprit. Pt notes walking on uneven ground causes increase in pain. Difficulty with bending over causes immediate pain    How long can you stand comfortably? 30-60 min    How long can you walk comfortably? 30-60 min    Diagnostic tests x-rays reveal DDD and DJD    Patient Stated Goals Have the back and LLE stop hurting    Currently in Pain? Yes    Pain Score 6      Pain Location Back    Pain Orientation Left    Pain Descriptors / Indicators Aching;Tightness;Spasm    Pain Type Chronic pain    Pain Radiating Towards left buttocks and posterior thigh, sometimes to front of left shin    Pain Onset More than a month ago    Pain Frequency Intermittent    Aggravating Factors  standing, prolonged sitting, bending forward, carrying weight in front              [facility] PT Assessment - 03/18/21 0001      Assessment   Medical Diagnosis Neck pain/ LBP    Referring Provider (PT) Lynne Leader, MD      Balance Screen   Has the patient fallen in the past 6 months No    Has the patient had a decrease in activity level because of a fear of falling?  No    Is the patient reluctant to leave their home because of a fear of falling?  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, sport and exercise and requires alot of      Observation/Other Assessments   Focus on Therapeutic Outcomes (FOTO)  41.8% function      ROM / Strength   AROM / PROM / Strength AROM;Strength      AROM   AROM Assessment Site Lumbar    Lumbar Flexion 25% limited    Lumbar Extension WNL    Lumbar - Right Side Bend WNL    Lumbar - Left Side Bend painful    Lumbar - Right Rotation WNL    Lumbar - Left Rotation WNL      Strength   Strength Assessment Site Lumbar    Lumbar Flexion 2+/5    Lumbar Extension 2+/5      Flexibility   Soft Tissue Assessment /Muscle Length yes    Quadriceps +Thomas Test    Piriformis limited hip IR due to tightness (prone)      Palpation   Palpation comment left QL TTP      Special Tests    Special Tests Lumbar    Lumbar Tests Slump Test;Straight Leg Raise;Prone Knee Bend Test      Slump test   Findings Negative    Side Left      Prone Knee Bend Test   Findings Positive    Side Left    Comment increased back pain       Straight Leg Raise   Findings Negative    Side  Left                      Objective measurements completed on examination: See above findings.       [facility] Adult PT Treatment/Exercise - 03/18/21 0001      Exercises   Exercises Knee/Hip;Lumbar      Lumbar Exercises: Stretches   Piriformis Stretch Left;4 reps;60 seconds    Piriformis Stretch Limitations supine and seated    Other Lumbar Stretch Exercise education on prone position trials and sitting with lumbar support                  PT Education - 03/18/21 1000    Education Details pt education in lumbar biomechanics and postural midfullness, e.g. prone position, sitting with lumbar support    Person(s) Educated Patient    Methods Explanation    Comprehension Verbalized understanding            PT Short Term Goals - 03/18/21 1034      PT SHORT TERM GOAL #1   Title Patient will report at least 25% improvement in symptoms for improved quality of life.    Time 3    Period Weeks    Status New    Target Date 04/08/21      PT SHORT TERM GOAL #2   Title Pt will exhibit pain-free lumbar ROM    Baseline pain with lumbar flexion and left side bending    Time 3    Period Weeks    Status New    Target Date 04/08/21      PT SHORT TERM GOAL #3   Title Patient will be independent with HEP in order to improve functional outcomes.    Time 3    Period Weeks    Status New    Target Date 04/08/21             PT Long Term Goals - 03/18/21 1035  PT LONG TERM GOAL #1   Title Patient will improve on FOTO score to meet predicted outcomes to improve functional independence    Baseline 41.8% function    Time 6    Period Weeks    Status New    Target Date 04/29/21      PT LONG TERM GOAL #2   Title Demo improved core strength/stability as evidenced by trunk flexion/extension of 4/5 to improve lifting mechanics to reduce risk for injury    Baseline 2+/5    Time 6    Period Weeks    Status  New    Target Date 04/29/21                  Plan - 03/18/21 1001    Clinical Impression Statement Patient is a  60 yo male presenting to physical therapy with c/o  LBP and left hip/buttock/LLE pain. He presents with pain limited deficits in trunk strength, ROM, endurance, postural impairments, spinal mobility and functional mobility with ADL. He is having to modify and restrict ADL as indicated by FOTO score as well as subjective information and objective measures which is affecting overall participation. Patient will benefit from skilled physical therapy in order to improve function and reduce impairment.    Personal Factors and Comorbidities Age;Comorbidity 1;Past/Current Experience;Time since onset of injury/illness/exacerbation    Comorbidities obesity    Examination-Activity Limitations Bend;Carry;Lift;Stand;Stairs;Squat;Sit;Locomotion Level;Transfers    Examination-Participation Restrictions Cleaning;Community Activity;Driving;Yard Work;Occupation    Stability/Clinical Decision Making Stable/Uncomplicated    Clinical Decision Making Low    Rehab Potential Good    PT Frequency 1x / week    PT Duration 6 weeks    PT Treatment/Interventions ADLs/Self Care Home Management;Aquatic Therapy;Biofeedback;Cryotherapy;Electrical Stimulation;DME Instruction;Traction;Moist Heat;Gait training;Stair training;Functional mobility training;Therapeutic activities;Therapeutic exercise;Balance training;Patient/family education;Neuromuscular re-education;Manual techniques;Passive range of motion;Energy conservation;Dry needling;Spinal Manipulations;Joint Manipulations    PT Next Visit Plan extension vs flexion bias?    PT Home Exercise Plan piriformis stretch (supine and sitting), ab set, sitting with good posture    Consulted and Agree with Plan of Care Patient           Patient will benefit from skilled therapeutic intervention in order to improve the following deficits and impairments:   Decreased activity tolerance,Decreased endurance,Decreased range of motion,Decreased strength,Difficulty walking,Increased muscle spasms,Impaired perceived functional ability,Postural dysfunction,Improper body mechanics,Obesity,Pain  Visit Diagnosis: Muscle weakness (generalized)  Chronic left-sided low back pain, unspecified whether sciatica present     Problem List Patient Active Problem List   Diagnosis Date Noted  . Hyperlipidemia 11/27/2017  . Diastolic CHF ([place]) 99/24/2683  . Low testosterone 11/17/2016  . former smoker 7.5 pack years quit 1990 11/17/2016  . Morbid obesity ([place]) 11/26/2015  . Elevated blood sugar 11/26/2015  . Chronic low back pain 08/15/2014  . OSA (obstructive sleep apnea) 09/08/2013  . SOB (shortness of breath) on exertion 06/16/2013  . Bright red rectal bleeding 06/16/2013  . 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 Essential hypertension, benign 03/05/2009   10:43 AM, 03/18/21 M. Sherlyn Lees, PT, DPT Physical Therapist- [place] Office Number: [phone]  [place] [address] [place], [place], [address] Phone: [phone]   Fax:  [phone]  Name: Kenneth Berry MRN: [id number] Date of Birth: 25-Feb-1961

## 2021-03-18 NOTE — Progress Notes (Signed)
   Rito Ehrlich, am serving as a Education administrator for Dr. Lynne Leader.  Kenneth Berry is a 60 y.o. male who presents to [place] [facility] today for f/u chronic L-sided low back pain w/ sciatica and cervical radiculopathy. Pt was last seen by Dr. Georgina Snell on 02/05/21 and was prescribed a short course of prednisone, gabapentin, and was referred to PT of which he's completed 1 visit. Today, pt reports that he feels about the same he does have 6 visits scheduled to see PT.   Dx imaging: 02/05/21 L-spine & C-spine XR  06/23/06 C-spine XR  06/14/05 C-spine XR  12/12/03 C-spine & L-spine MRI  Pertinent review of systems: No fevers or chills  Relevant historical information: Hypertension, sleep apnea, diastolic heart failure.   Exam:  BP 122/82 (BP Location: Left Arm, Patient Position: Sitting, Cuff Size: Large)   Pulse 88   Ht 5\' 10"  (1.778 m)   Wt 275 lb (124.7 kg)   SpO2 96%   BMI 39.46 kg/m  General: Well Developed, well nourished, and in no acute distress.   MSK: C-spine normal-appearing nontender decreased cervical motion.  Upper extremity strength slight decreased left hand grip strength compared to right.  L-spine decreased lumbar motion.    Lab and Radiology Results  EXAM: CERVICAL SPINE - 2-3 VIEW  COMPARISON:  06/23/2006  FINDINGS: Generalized straightening of the cervical spine. Post fusion changes C5 through C7. Potential mild lucency about the fixating screws at C5 and C6. Moderate severe degenerative change at C2-C3. Slightly prominent prevertebral soft tissues measuring up to 7 mm. Dens and lateral masses are within normal limits.  IMPRESSION: 1. Post fusion changes C5 through C7. Potential mild lucency about the fixating screws at C5 and C6, possible loosening. 2. Prevertebral soft tissues are borderline to slightly enlarged; if symptoms suggest soft tissue infection, further evaluation with CT or MRI could be obtained.  These results will  be called to the ordering clinician or representative by the Radiologist Assistant, and communication documented in the PACS or [employer].   Electronically Signed   By: Donavan Foil M.D.   On: 02/05/2021 23:13   EXAM: LUMBAR SPINE - 2-3 VIEW  COMPARISON:  None.  FINDINGS: Five non rib-bearing lumbar type vertebra. Alignment within normal limits. Vertebral body heights are maintained. Minimal degenerative changes at L2-L3 and L4-L5.  IMPRESSION: Minimal degenerative change. No acute osseous abnormality.   Electronically Signed   By: Donavan Foil M.D.   On: 02/05/2021 23:14  I, Lynne Leader, personally (independently) visualized and performed the interpretation of the images attached in this note.   Assessment and Plan: 60 y.o. male with cervical radiculopathy and lumbosacral radiculopathy.  Patient has not improved much over the last 6 weeks with some home exercise program.  He is just starting physical therapy now which may be helpful.  He has experienced a bit of weakness in his left hand.  If worsening or not improving we may proceed to MRI of cervical spine sooner.  Patient will keep me updated otherwise check back in about 6 or 7 weeks.   PDMP not reviewed this encounter. No orders of the defined types were placed in this encounter.  No orders of the defined types were placed in this encounter.    Discussed warning signs or symptoms. Please see discharge instructions. Patient expresses understanding.   The above documentation has been reviewed and is accurate and complete Lynne Leader, M.D.

## 2021-03-18 NOTE — Patient Instructions (Signed)
Access Code: [email or web address] URL: [email or web address] Date: 03/18/2021 Prepared by: Sherlyn Lees  Exercises Supine Figure 4 Piriformis Stretch - 1 x daily - 7 x weekly - 3 sets - 10 reps - 15-30 sec hold Seated Piriformis Stretch - 1 x daily - 7 x weekly - 3 sets - 10 reps - 15-30 sec hold Lying Prone - 1 x daily - 7 x weekly Supine Posterior Pelvic Tilt - 1 x daily - 7 x weekly - 3 sets - 10 reps

## 2021-03-19 ENCOUNTER — Ambulatory Visit: Payer: 59 | Admitting: Family Medicine

## 2021-03-19 ENCOUNTER — Encounter: Payer: Self-pay | Admitting: Family Medicine

## 2021-03-19 VITALS — BP 122/82 | HR 88 | Ht 70.0 in | Wt 275.0 lb

## 2021-03-19 DIAGNOSIS — M5412 Radiculopathy, cervical region: Secondary | ICD-10-CM | POA: Diagnosis not present

## 2021-03-19 DIAGNOSIS — M5442 Lumbago with sciatica, left side: Secondary | ICD-10-CM | POA: Diagnosis not present

## 2021-03-19 DIAGNOSIS — G8929 Other chronic pain: Secondary | ICD-10-CM

## 2021-03-19 NOTE — Patient Instructions (Signed)
Thank you for coming in today.  Next step is MRI of the neck.   Let me know when you are ready.   Keep me updated.   Otherwise recheck in 7 weeks.

## 2021-03-26 ENCOUNTER — Other Ambulatory Visit: Payer: Self-pay

## 2021-03-26 ENCOUNTER — Ambulatory Visit (HOSPITAL_COMMUNITY): Payer: 59

## 2021-03-26 DIAGNOSIS — M6281 Muscle weakness (generalized): Secondary | ICD-10-CM

## 2021-03-26 DIAGNOSIS — G8929 Other chronic pain: Secondary | ICD-10-CM

## 2021-03-26 DIAGNOSIS — M545 Low back pain, unspecified: Secondary | ICD-10-CM

## 2021-03-26 NOTE — Therapy (Signed)
[place], [place], [address] Phone: [phone]   Fax:  [phone]  Physical Therapy Treatment  Patient Details  Name: Kenneth Berry MRN: [id number] Date of Birth: 06-11-1961 Referring Provider (PT): Lynne Leader, MD   Encounter Date: 03/26/2021   PT End of Session - 03/26/21 1017    Visit Number 2    Number of Visits 6    Date for PT Re-Evaluation 04/29/21    Authorization Type [employer], no auth, 30 VL    Authorization - Visit Number 2    Authorization - Number of Visits 30    PT Start Time 0945    PT Stop Time 1030    PT Time Calculation (min) 45 min    Activity Tolerance Patient tolerated treatment well           Past Medical History:  Diagnosis Date  . Allergy    seasonal spring the worst   . Arthritis    spine,neck  . Hemorrhoid    occasionally bleeds   . Hypertension   . Sleep apnea    wears cpap    Past Surgical History:  Procedure Laterality Date  . 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 Neck fusion    . TONSILLECTOMY AND ADENOIDECTOMY    . UVULOPALATOPHARYNGOPLASTY      There were no vitals filed for this visit.   Subjective Assessment - 03/26/21 0946    Subjective Continued left buttocks pain radiating to posterior left thigh. Pt reports he was weed-eating the other day and noted increased pain in low back and buttocks after sitting down for a rest period    How long can you stand comfortably? 30-60 min    How long can you walk comfortably? 30-60 min    Diagnostic tests x-rays reveal DDD and DJD    Patient Stated Goals Have the back and LLE stop hurting    Currently in Pain? Yes    Pain Score 4     Pain Location Buttocks    Pain Orientation Left    Pain Type Chronic pain    Pain Onset More than a month ago              [facility] PT Assessment - 03/26/21 0001      Assessment   Medical Diagnosis Neck pain/ LBP    Referring Provider (PT) Lynne Leader, MD                          [facility] Adult PT Treatment/Exercise - 03/26/21 0001      Lumbar Exercises: Stretches   Press Ups 20 reps;5 seconds    Quad Stretch Right;Left;2 reps;60 seconds    Piriformis Stretch Left;4 reps;60 seconds    Piriformis Stretch Limitations supine and seated      Lumbar Exercises: Standing   Other Standing Lumbar Exercises lumbar extensions      Lumbar Exercises: Prone   Other Prone Lumbar Exercises prone lying x 5 min. Prone press-ups 2x10 3 sec hold    Other Prone Lumbar Exercises prone heel squeeze                  PT Education - 03/26/21 1006    Education Details education on extension-bias activities to perorm throughout the day to assess for centralization    Person(s) Educated Patient    Methods Explanation;Demonstration    Comprehension Verbalized understanding;Returned demonstration  PT Short Term Goals - 03/18/21 1034      PT SHORT TERM GOAL #1   Title Patient will report at least 25% improvement in symptoms for improved quality of life.    Time 3    Period Weeks    Status New    Target Date 04/08/21      PT SHORT TERM GOAL #2   Title Pt will exhibit pain-free lumbar ROM    Baseline pain with lumbar flexion and left side bending    Time 3    Period Weeks    Status New    Target Date 04/08/21      PT SHORT TERM GOAL #3   Title Patient will be independent with HEP in order to improve functional outcomes.    Time 3    Period Weeks    Status New    Target Date 04/08/21             PT Long Term Goals - 03/18/21 1035      PT LONG TERM GOAL #1   Title Patient will improve on FOTO score to meet predicted outcomes to improve functional independence    Baseline 41.8% function    Time 6    Period Weeks    Status New    Target Date 04/29/21      PT LONG TERM GOAL #2   Title Demo improved core strength/stability as evidenced by trunk flexion/extension of 4/5 to improve lifting mechanics to reduce  risk for injury    Baseline 2+/5    Time 6    Period Weeks    Status New    Target Date 04/29/21                 Plan - 03/26/21 1018    Clinical Impression Statement Pt continues to report left LE radicular symptoms seemingly with more flexion-biased movements. Pt educated in extension-biased techniuqes to perform during work-day to assess if centralization occurs.    Personal Factors and Comorbidities Age;Comorbidity 1;Past/Current Experience;Time since onset of injury/illness/exacerbation    Comorbidities obesity    Examination-Activity Limitations Bend;Carry;Lift;Stand;Stairs;Squat;Sit;Locomotion Level;Transfers    Examination-Participation Restrictions Cleaning;Community Activity;Driving;Yard Work;Occupation    Stability/Clinical Decision Making Stable/Uncomplicated    Rehab Potential Good    PT Frequency 1x / week    PT Duration 6 weeks    PT Treatment/Interventions ADLs/Self Care Home Management;Aquatic Therapy;Biofeedback;Cryotherapy;Electrical Stimulation;DME Instruction;Traction;Moist Heat;Gait training;Stair training;Functional mobility training;Therapeutic activities;Therapeutic exercise;Balance training;Patient/family education;Neuromuscular re-education;Manual techniques;Passive range of motion;Energy conservation;Dry needling;Spinal Manipulations;Joint Manipulations    PT Next Visit Plan extension vs flexion bias?    PT Home Exercise Plan piriformis stretch (supine and sitting), ab set, sitting with good posture    Consulted and Agree with Plan of Care Patient           Patient will benefit from skilled therapeutic intervention in order to improve the following deficits and impairments:  Decreased activity tolerance,Decreased endurance,Decreased range of motion,Decreased strength,Difficulty walking,Increased muscle spasms,Impaired perceived functional ability,Postural dysfunction,Improper body mechanics,Obesity,Pain  Visit Diagnosis: Muscle weakness  (generalized)  Chronic left-sided low back pain, unspecified whether sciatica present     Problem List Patient Active Problem List   Diagnosis Date Noted  . Hyperlipidemia 11/27/2017  . Diastolic CHF ([place]) 14/78/2956  . Low testosterone 11/17/2016  . former smoker 7.5 pack years quit 1990 11/17/2016  . Morbid obesity ([place]) 11/26/2015  . Elevated blood sugar 11/26/2015  . Chronic low back pain 08/15/2014  . OSA (obstructive sleep apnea) 09/08/2013  .  SOB (shortness of breath) on exertion 06/16/2013  . Bright red rectal bleeding 06/16/2013  . 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 Essential hypertension, benign 03/05/2009   10:29 AM, 03/26/21 M. Sherlyn Lees, PT, DPT Physical Therapist- [place] Office Number: [phone]  [facility] [address] [place], [place], [address] Phone: [phone]   Fax:  [phone]  Name: Kenneth Berry MRN: [id number] Date of Birth: 08/31/1961

## 2021-04-02 ENCOUNTER — Other Ambulatory Visit: Payer: Self-pay

## 2021-04-02 ENCOUNTER — Encounter (HOSPITAL_COMMUNITY): Payer: Self-pay

## 2021-04-02 ENCOUNTER — Ambulatory Visit (HOSPITAL_COMMUNITY): Payer: 59

## 2021-04-02 DIAGNOSIS — M6281 Muscle weakness (generalized): Secondary | ICD-10-CM

## 2021-04-02 DIAGNOSIS — M545 Low back pain, unspecified: Secondary | ICD-10-CM

## 2021-04-02 NOTE — Therapy (Signed)
[place], [place], [address] Phone: [phone]   Fax:  [phone]  Physical Therapy Treatment  Patient Details  Name: Kenneth Berry MRN: [id number] Date of Birth: 09-24-1961 Referring Provider (PT): Lynne Leader, MD   Encounter Date: 04/02/2021   PT End of Session - 04/02/21 0931    Visit Number 3    Number of Visits 6    Date for PT Re-Evaluation 04/29/21    Authorization Type [employer], no auth, 30 VL    Authorization - Visit Number 3    Authorization - Number of Visits 30    Progress Note Due on Visit 6    PT Start Time 0916    PT Stop Time 0956    PT Time Calculation (min) 40 min    Activity Tolerance Patient tolerated treatment well    Behavior During Therapy [facility] for tasks assessed/performed           Past Medical History:  Diagnosis Date  . Allergy    seasonal spring the worst   . Arthritis    spine,neck  . Hemorrhoid    occasionally bleeds   . Hypertension   . Sleep apnea    wears cpap    Past Surgical History:  Procedure Laterality Date  . 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 Neck fusion    . TONSILLECTOMY AND ADENOIDECTOMY    . UVULOPALATOPHARYNGOPLASTY      There were no vitals filed for this visit.   Subjective Assessment - 04/02/21 0919    Subjective Pt reports soreness Lt buttocks with occasional radiating pain posterior LE while walking/ standing.  Reports some pain Rt LBP during functional activities cleaning and doing yardwork.  No reports of reduction radicular symptoms following last session.  Reports compliance with HEP daily.    Patient Stated Goals Have the back and LLE stop hurting    Currently in Pain? Yes    Pain Score 3     Pain Location Buttocks    Pain Orientation Left    Pain Descriptors / Indicators Sore;Discomfort    Pain Type Chronic pain    Pain Radiating Towards left buttocks and posterior thigh, sometimes to front of left shin (no  radicular symptoms currently)    Pain Onset More than a month ago    Pain Frequency Intermittent    Aggravating Factors  standing, prolonged sitting, bending forward, carrying weight in front    Pain Relieving Factors positions    Effect of Pain on Daily Activities Limits                             [facility] Adult PT Treatment/Exercise - 04/02/21 0001      Exercises   Exercises Lumbar      Lumbar Exercises: Stretches   Single Knee to Chest Stretch 3 reps;30 seconds    Single Knee to Chest Stretch Limitations towel assistance    Lower Trunk Rotation 5 reps;10 seconds      Lumbar Exercises: Supine   Ab Set 5 reps;5 seconds    AB Set Limitations cueing for breathing throughout, exhale wiht exertion    Pelvic Tilt 10 reps;5 seconds    Pelvic Tilt Limitations posterior pelvic tilt    Other Supine Lumbar Exercises 1 min awareness of breathing    Other Supine Lumbar Exercises 1 min paired ab set with exhale  PT Education - 04/02/21 0931    Education Details Pt stated no change in symptoms via extension-bias activiites.  Discussed extension-bias vs flexion-bias activities    Person(s) Educated Patient    Methods Explanation    Comprehension Verbalized understanding;Returned demonstration            PT Short Term Goals - 03/18/21 1034      PT SHORT TERM GOAL #1   Title Patient will report at least 25% improvement in symptoms for improved quality of life.    Time 3    Period Weeks    Status New    Target Date 04/08/21      PT SHORT TERM GOAL #2   Title Pt will exhibit pain-free lumbar ROM    Baseline pain with lumbar flexion and left side bending    Time 3    Period Weeks    Status New    Target Date 04/08/21      PT SHORT TERM GOAL #3   Title Patient will be independent with HEP in order to improve functional outcomes.    Time 3    Period Weeks    Status New    Target Date 04/08/21             PT Long Term Goals -  03/18/21 1035      PT LONG TERM GOAL #1   Title Patient will improve on FOTO score to meet predicted outcomes to improve functional independence    Baseline 41.8% function    Time 6    Period Weeks    Status New    Target Date 04/29/21      PT LONG TERM GOAL #2   Title Demo improved core strength/stability as evidenced by trunk flexion/extension of 4/5 to improve lifting mechanics to reduce risk for injury    Baseline 2+/5    Time 6    Period Weeks    Status New    Target Date 04/29/21                 Plan - 04/02/21 0947    Clinical Impression Statement Pt reports no change with Lt radicular symptoms with extension-biased movements.  Pt educated on spinal anatomy, importance of abdominal strengthening to support back.  Trial with flexion based activiites and positive results at EOS.  Added SKTC, DKTC and abdominal strengtheing exercises.  Pt c/o cramping through session and reports of difficulty breathing in supine positive, better with semi-recumbent position (wedge under head).  EOS pt reports pain resolved and no radicular symptoms.    Personal Factors and Comorbidities Age;Comorbidity 1;Past/Current Experience;Time since onset of injury/illness/exacerbation    Comorbidities obesity    Examination-Activity Limitations Bend;Carry;Lift;Stand;Stairs;Squat;Sit;Locomotion Level;Transfers    Examination-Participation Restrictions Cleaning;Community Activity;Driving;Yard Work;Occupation    Clinical Decision Making Low    Rehab Potential Good    PT Frequency 1x / week    PT Duration 6 weeks    PT Treatment/Interventions ADLs/Self Care Home Management;Aquatic Therapy;Biofeedback;Cryotherapy;Electrical Stimulation;DME Instruction;Traction;Moist Heat;Gait training;Stair training;Functional mobility training;Therapeutic activities;Therapeutic exercise;Balance training;Patient/family education;Neuromuscular re-education;Manual techniques;Passive range of motion;Energy conservation;Dry  needling;Spinal Manipulations;Joint Manipulations    PT Next Visit Plan f/u wiht extension vs flexion bias?    PT Home Exercise Plan piriformis stretch (supine and sitting), ab set, sitting with good posture; 4/26: posterior pelvic tilt, SKTC    Consulted and Agree with Plan of Care Patient           Patient will benefit from skilled therapeutic intervention in order to  improve the following deficits and impairments:  Decreased activity tolerance,Decreased endurance,Decreased range of motion,Decreased strength,Difficulty walking,Increased muscle spasms,Impaired perceived functional ability,Postural dysfunction,Improper body mechanics,Obesity,Pain  Visit Diagnosis: Chronic left-sided low back pain, unspecified whether sciatica present  Muscle weakness (generalized)     Problem List Patient Active Problem List   Diagnosis Date Noted  . Hyperlipidemia 11/27/2017  . Diastolic CHF ([place]) 40/76/8088  . Low testosterone 11/17/2016  . former smoker 7.5 pack years quit 1990 11/17/2016  . Morbid obesity ([place]) 11/26/2015  . Elevated blood sugar 11/26/2015  . Chronic low back pain 08/15/2014  . OSA (obstructive sleep apnea) 09/08/2013  . SOB (shortness of breath) on exertion 06/16/2013  . Bright red rectal bleeding 06/16/2013  . 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 Essential hypertension, benign 03/05/2009   Ihor Austin, LPTA/CLT; CBIS [phone]  Aldona Lento 04/02/2021, 11:11 AM  [place], [place], [address] Phone: [phone]   Fax:  [phone]  Name: Kenneth Berry MRN: [id number] Date of Birth: 11/13/1961

## 2021-04-09 ENCOUNTER — Other Ambulatory Visit: Payer: Self-pay

## 2021-04-09 ENCOUNTER — Encounter (HOSPITAL_COMMUNITY): Payer: Self-pay

## 2021-04-09 ENCOUNTER — Ambulatory Visit (HOSPITAL_COMMUNITY): Payer: 59 | Attending: Family Medicine

## 2021-04-09 DIAGNOSIS — M545 Low back pain, unspecified: Secondary | ICD-10-CM | POA: Diagnosis present

## 2021-04-09 DIAGNOSIS — G8929 Other chronic pain: Secondary | ICD-10-CM | POA: Diagnosis present

## 2021-04-09 DIAGNOSIS — M6281 Muscle weakness (generalized): Secondary | ICD-10-CM | POA: Diagnosis present

## 2021-04-09 NOTE — Therapy (Signed)
[place], [place], [address] Phone: [phone]   Fax:  [phone]  Physical Therapy Treatment  Patient Details  Name: Kenneth Berry MRN: [id number] Date of Birth: [birth year] Referring Provider (PT): Lynne Leader, MD   Encounter Date: 04/09/2021   PT End of Session - 04/09/21 0907    Visit Number 4    Number of Visits 6    Date for PT Re-Evaluation 04/29/21    Authorization Type [employer], no auth, 30 VL    Authorization - Visit Number 4    Authorization - Number of Visits 30    Progress Note Due on Visit 6    PT Start Time 0900    PT Stop Time 0945    PT Time Calculation (min) 45 min    Activity Tolerance Patient tolerated treatment well    Behavior During Therapy [facility] for tasks assessed/performed           Past Medical History:  Diagnosis Date  . Allergy    seasonal spring the worst   . Arthritis    spine,neck  . Hemorrhoid    occasionally bleeds   . Hypertension   . Sleep apnea    wears cpap    Past Surgical History:  Procedure Laterality Date  . 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 Neck fusion    . TONSILLECTOMY AND ADENOIDECTOMY    . UVULOPALATOPHARYNGOPLASTY      There were no vitals filed for this visit.   Subjective Assessment - 04/09/21 0906    Subjective Pt reports continued symptoms radiating into LLE  extending from buttocks extending to lateral aspect of left calf and ankle    Patient Stated Goals Have the back and LLE stop hurting    Currently in Pain? Yes    Pain Score 4     Pain Location Buttocks    Pain Orientation Left    Pain Descriptors / Indicators Sore;Spasm    Pain Type Chronic pain    Pain Onset More than a month ago                             [facility] Adult PT Treatment/Exercise - 04/09/21 0001      Lumbar Exercises: Prone   Other Prone Lumbar Exercises prone lying x 5 min. Prone press-ups 2x10 3 sec hold    Other Prone  Lumbar Exercises prone heel squeeze      Manual Therapy   Manual Therapy Soft tissue mobilization;Joint mobilization    Manual therapy comments completed separate from all other interventions    Joint Mobilization lumbar spine PA grade 2 and grade 3 to improve l-spine extension and oscillations to decrease pain/guarding    Soft tissue mobilization STM to lumbar paraspinals and QL with pronounced spasm and sensitivity in right QL appreciated.                    PT Short Term Goals - 03/18/21 1034      PT SHORT TERM GOAL #1   Title Patient will report at least 25% improvement in symptoms for improved quality of life.    Time 3    Period Weeks    Status New    Target Date 04/08/21      PT SHORT TERM GOAL #2   Title Pt will exhibit pain-free lumbar ROM    Baseline pain with  lumbar flexion and left side bending    Time 3    Period Weeks    Status New    Target Date 04/08/21      PT SHORT TERM GOAL #3   Title Patient will be independent with HEP in order to improve functional outcomes.    Time 3    Period Weeks    Status New    Target Date 04/08/21             PT Long Term Goals - 03/18/21 1035      PT LONG TERM GOAL #1   Title Patient will improve on FOTO score to meet predicted outcomes to improve functional independence    Baseline 41.8% function    Time 6    Period Weeks    Status New    Target Date 04/29/21      PT LONG TERM GOAL #2   Title Demo improved core strength/stability as evidenced by trunk flexion/extension of 4/5 to improve lifting mechanics to reduce risk for injury    Baseline 2+/5    Time 6    Period Weeks    Status New    Target Date 04/29/21                 Plan - 04/09/21 1124    Clinical Impression Statement TOlerated tx session well with sensitivity noted in right QL and paraspinals with spasm present.  Unfortunately no centralization of LE radicular symptoms have been appreciated despite flexion or extension bias and  continues to report increased pain with exertion and his day-to-day work detail and does not change based on repeated motion or positional changes.    Personal Factors and Comorbidities Age;Comorbidity 1;Past/Current Experience;Time since onset of injury/illness/exacerbation    Comorbidities obesity    Examination-Activity Limitations Bend;Carry;Lift;Stand;Stairs;Squat;Sit;Locomotion Level;Transfers    Examination-Participation Restrictions Cleaning;Community Activity;Driving;Yard Work;Occupation    Rehab Potential Good    PT Frequency 1x / week    PT Duration 6 weeks    PT Treatment/Interventions ADLs/Self Care Home Management;Aquatic Therapy;Biofeedback;Cryotherapy;Electrical Stimulation;DME Instruction;Traction;Moist Heat;Gait training;Stair training;Functional mobility training;Therapeutic activities;Therapeutic exercise;Balance training;Patient/family education;Neuromuscular re-education;Manual techniques;Passive range of motion;Energy conservation;Dry needling;Spinal Manipulations;Joint Manipulations    PT Next Visit Plan f/u wiht extension vs flexion bias?    PT Home Exercise Plan piriformis stretch (supine and sitting), ab set, sitting with good posture; 4/26: posterior pelvic tilt, SKTC    Consulted and Agree with Plan of Care Patient           Patient will benefit from skilled therapeutic intervention in order to improve the following deficits and impairments:  Decreased activity tolerance,Decreased endurance,Decreased range of motion,Decreased strength,Difficulty walking,Increased muscle spasms,Impaired perceived functional ability,Postural dysfunction,Improper body mechanics,Obesity,Pain  Visit Diagnosis: Chronic left-sided low back pain, unspecified whether sciatica present  Muscle weakness (generalized)     Problem List Patient Active Problem List   Diagnosis Date Noted  . Hyperlipidemia 11/27/2017  . Diastolic CHF ([place]) 76/28/3151  . Low testosterone 11/17/2016  .  former smoker 7.5 pack years quit 1990 11/17/2016  . Morbid obesity ([place]) 11/26/2015  . Elevated blood sugar 11/26/2015  . Chronic low back pain 08/15/2014  . OSA (obstructive sleep apnea) 09/08/2013  . SOB (shortness of breath) on exertion 06/16/2013  . Bright red rectal bleeding 06/16/2013  . 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 Essential hypertension, benign 03/05/2009   11:27 AM, 04/09/21 M. Sherlyn Lees, PT, DPT Physical Therapist- [place] Office Number: [phone]  [place] [address]  [place], [place], [address] Phone: [phone]   Fax:  [phone]  Name: Kenneth Berry MRN: [id number] Date of Birth: 09/26/1961

## 2021-04-16 ENCOUNTER — Ambulatory Visit (HOSPITAL_COMMUNITY): Payer: 59 | Admitting: Physical Therapy

## 2021-04-16 ENCOUNTER — Other Ambulatory Visit: Payer: Self-pay

## 2021-04-16 ENCOUNTER — Encounter (HOSPITAL_COMMUNITY): Payer: Self-pay | Admitting: Physical Therapy

## 2021-04-16 DIAGNOSIS — M545 Low back pain, unspecified: Secondary | ICD-10-CM

## 2021-04-16 DIAGNOSIS — G8929 Other chronic pain: Secondary | ICD-10-CM

## 2021-04-16 DIAGNOSIS — M6281 Muscle weakness (generalized): Secondary | ICD-10-CM

## 2021-04-16 NOTE — Patient Instructions (Signed)
Access Code: [email or web address] URL: [email or web address] Date: 04/16/2021 Prepared by: Mitzi Hansen Darielle Hancher  Exercises Supine Piriformis Stretch with Leg Straight - 1 x daily - 7 x weekly - 5 reps - 20 second hold Prone Hip Extension - 1 x daily - 7 x weekly - 2 sets - 10 reps

## 2021-04-16 NOTE — Therapy (Signed)
[place], [place], [address] Phone: [phone]   Fax:  [phone]  Physical Therapy Treatment  Patient Details  Name: Kenneth Berry MRN: [id number] Date of Birth: 07-Jul-1961 Referring Provider (PT): Lynne Leader, MD   Encounter Date: 04/16/2021   PT End of Session - 04/16/21 1003    Visit Number 5    Number of Visits 6    Date for PT Re-Evaluation 04/29/21    Authorization Type [employer], no auth, 30 VL    Authorization - Visit Number 5    Authorization - Number of Visits 30    Progress Note Due on Visit 6    PT Start Time 1003    PT Stop Time 1042    PT Time Calculation (min) 39 min    Activity Tolerance Patient tolerated treatment well    Behavior During Therapy [facility] for tasks assessed/performed           Past Medical History:  Diagnosis Date  . Allergy    seasonal spring the worst   . Arthritis    spine,neck  . Hemorrhoid    occasionally bleeds   . Hypertension   . Sleep apnea    wears cpap    Past Surgical History:  Procedure Laterality Date  . 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 Neck fusion    . TONSILLECTOMY AND ADENOIDECTOMY    . UVULOPALATOPHARYNGOPLASTY      There were no vitals filed for this visit.   Subjective Assessment - 04/16/21 1004    Subjective Patient states back has been about the same. He does not notice much difference. Figure 4 stretch helps the most. He knows he has a disc herniation.    Patient Stated Goals Have the back and LLE stop hurting    Currently in Pain? Yes    Pain Score 3     Pain Location Buttocks    Pain Orientation Left    Pain Descriptors / Indicators Sore    Pain Type Chronic pain    Pain Onset More than a month ago    Pain Frequency Constant                             [facility] Adult PT Treatment/Exercise - 04/16/21 0001      Lumbar Exercises: Stretches   Other Lumbar Stretch Exercise Piriformis  stretch 3x 20 second holds bilateral      Lumbar Exercises: Standing   Other Standing Lumbar Exercises row with blue band 2x 15, extension with blue band 2x 15, palof with blue band 2x 15 bilateral      Lumbar Exercises: Seated   Other Seated Lumbar Exercises abdominal isometric 10 x 5 second holds      Lumbar Exercises: Supine   Glut Set 5 reps;5 seconds    Bridge 5 reps    Bridge Limitations hamstring cramping    Straight Leg Raise 10 reps    Straight Leg Raises Limitations 2 sets      Lumbar Exercises: Prone   Straight Leg Raise 10 reps;3 seconds    Straight Leg Raises Limitations bilateral                  PT Education - 04/16/21 1003    Education Details HEP, exercise mechanics    Person(s) Educated Patient    Methods Explanation;Demonstration    Comprehension Verbalized understanding;Returned  demonstration            PT Short Term Goals - 03/18/21 1034      PT SHORT TERM GOAL #1   Title Patient will report at least 25% improvement in symptoms for improved quality of life.    Time 3    Period Weeks    Status New    Target Date 04/08/21      PT SHORT TERM GOAL #2   Title Pt will exhibit pain-free lumbar ROM    Baseline pain with lumbar flexion and left side bending    Time 3    Period Weeks    Status New    Target Date 04/08/21      PT SHORT TERM GOAL #3   Title Patient will be independent with HEP in order to improve functional outcomes.    Time 3    Period Weeks    Status New    Target Date 04/08/21             PT Long Term Goals - 03/18/21 1035      PT LONG TERM GOAL #1   Title Patient will improve on FOTO score to meet predicted outcomes to improve functional independence    Baseline 41.8% function    Time 6    Period Weeks    Status New    Target Date 04/29/21      PT LONG TERM GOAL #2   Title Demo improved core strength/stability as evidenced by trunk flexion/extension of 4/5 to improve lifting mechanics to reduce risk for  injury    Baseline 2+/5    Time 6    Period Weeks    Status New    Target Date 04/29/21                 Plan - 04/16/21 1003    Clinical Impression Statement Began session with supine piriformis stretch which targets concordant region. Patient with c/o right anterior hip cramping with piriformis stretch and had to stop for today with it. Patient given cueing for glute set prior to initiation of bridging exercise. Patient unable to complete bridge due to hamstring ramping despite cueing for glute activation. Began postural strengthening which patient tolerates well without increase symptoms. Patient will continue to benefit from skilled physical therapy in order to reduce impairment and improve function.    Personal Factors and Comorbidities Age;Comorbidity 1;Past/Current Experience;Time since onset of injury/illness/exacerbation    Comorbidities obesity    Examination-Activity Limitations Bend;Carry;Lift;Stand;Stairs;Squat;Sit;Locomotion Level;Transfers    Examination-Participation Restrictions Cleaning;Community Activity;Driving;Yard Work;Occupation    Rehab Potential Good    PT Frequency 1x / week    PT Duration 6 weeks    PT Treatment/Interventions ADLs/Self Care Home Management;Aquatic Therapy;Biofeedback;Cryotherapy;Electrical Stimulation;DME Instruction;Traction;Moist Heat;Gait training;Stair training;Functional mobility training;Therapeutic activities;Therapeutic exercise;Balance training;Patient/family education;Neuromuscular re-education;Manual techniques;Passive range of motion;Energy conservation;Dry needling;Spinal Manipulations;Joint Manipulations    PT Next Visit Plan possibly D/C    PT Home Exercise Plan piriformis stretch (supine and sitting), ab set, sitting with good posture; 4/26: posterior pelvic tilt, SKTC 5/10 piriformis stretch, hip extension    Consulted and Agree with Plan of Care Patient           Patient will benefit from skilled therapeutic intervention  in order to improve the following deficits and impairments:  Decreased activity tolerance,Decreased endurance,Decreased range of motion,Decreased strength,Difficulty walking,Increased muscle spasms,Impaired perceived functional ability,Postural dysfunction,Improper body mechanics,Obesity,Pain  Visit Diagnosis: Chronic left-sided low back pain, unspecified whether sciatica present  Muscle weakness (generalized)  Problem List Patient Active Problem List   Diagnosis Date Noted  . Hyperlipidemia 11/27/2017  . Diastolic CHF ([place]) 38/09/1750  . Low testosterone 11/17/2016  . former smoker 7.5 pack years quit 1990 11/17/2016  . Morbid obesity ([place]) 11/26/2015  . Elevated blood sugar 11/26/2015  . Chronic low back pain 08/15/2014  . OSA (obstructive sleep apnea) 09/08/2013  . SOB (shortness of breath) on exertion 06/16/2013  . Bright red rectal bleeding 06/16/2013  . 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 Essential hypertension, benign 03/05/2009    10:46 AM, 04/16/21 Mearl Latin PT, DPT Physical Therapist at [place], [place], [address] Phone: [phone]   Fax:  [phone]  Name: Kenneth Berry MRN: [id number] Date of Birth: 1961-08-04

## 2021-04-23 ENCOUNTER — Ambulatory Visit (HOSPITAL_COMMUNITY): Payer: 59

## 2021-04-23 ENCOUNTER — Other Ambulatory Visit: Payer: Self-pay

## 2021-04-23 DIAGNOSIS — M545 Low back pain, unspecified: Secondary | ICD-10-CM

## 2021-04-23 DIAGNOSIS — M6281 Muscle weakness (generalized): Secondary | ICD-10-CM

## 2021-04-23 NOTE — Therapy (Signed)
[facility] [address] [place], [place], [address] Phone: [phone]   Fax:  [phone]  Physical Therapy Treatment and D/C Summary  Patient Details  Name: Kenneth Berry MRN: [id number] Date of Birth: Jun 05, 1961 Referring Provider (PT): Lynne Leader, MD  PHYSICAL THERAPY DISCHARGE SUMMARY  Visits from Start of Care: 6  Current functional level related to goals / functional outcomes: Unable to ameliorate back pain or LE symptoms. 47% FOTO from 41% function   Remaining deficits: Continued LBP and radicular LE pain   Education / Equipment: HEP Plan: Patient agrees to discharge.  Patient goals were not met. Patient is being discharged due to lack of progress.  ?????       Encounter Date: 04/23/2021   PT End of Session - 04/23/21 0904    Visit Number 6    Number of Visits 6    Date for PT Re-Evaluation 04/29/21    Authorization Type [employer], no auth, 30 VL    Authorization - Visit Number 6    Authorization - Number of Visits 30    Progress Note Due on Visit 6    PT Start Time 0904    PT Stop Time 0935    PT Time Calculation (min) 31 min    Activity Tolerance Patient tolerated treatment well    Behavior During Therapy WFL for tasks assessed/performed           Past Medical History:  Diagnosis Date  . Allergy    seasonal spring the worst   . Arthritis    spine,neck  . Hemorrhoid    occasionally bleeds   . Hypertension   . Sleep apnea    wears cpap    Past Surgical History:  Procedure Laterality Date  . 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 Neck fusion    . TONSILLECTOMY AND ADENOIDECTOMY    . UVULOPALATOPHARYNGOPLASTY      There were no vitals filed for this visit.   Subjective Assessment - 04/23/21 0907    Subjective Not much improvement with the low back. Pt notes continued radicular pain in BLE LLE > RLE which is exacerbated with activity. Pt has noted little improvement with  therapy activities    How long can you stand comfortably? 60 min with discomfort    How long can you walk comfortably? at least a mile on flat groun, difficulty with uneven ground, approx 100 yards    Patient Stated Goals Have the back and LLE stop hurting    Currently in Pain? Yes    Pain Score 3     Pain Location Back    Pain Orientation Left    Pain Descriptors / Indicators Sore    Pain Type Chronic pain    Pain Onset More than a month ago                                       PT Short Term Goals - 04/23/21 0913      PT SHORT TERM GOAL #1   Title Patient will report at least 25% improvement in symptoms for improved quality of life.    Baseline No improvement in symptoms    Time 3    Period Weeks    Status Not Met    Target Date 04/08/21      PT SHORT TERM GOAL #2  Title Pt will exhibit pain-free lumbar ROM    Baseline pain with lumbar flexion and left side bending, approx 10% limited. INcrease in LLE radicular symptoms    Time 3    Period Weeks    Status Not Met    Target Date 04/08/21      PT SHORT TERM GOAL #3   Title Patient will be independent with HEP in order to improve functional outcomes.    Baseline most relief with prone position and piriformis stretch    Time 3    Period Weeks    Status Achieved    Target Date 04/08/21             PT Long Term Goals - 04/23/21 0915      PT LONG TERM GOAL #1   Title Patient will improve on FOTO score to meet predicted outcomes to improve functional independence    Baseline 47% function    Time 6    Period Weeks    Status Not Met      PT LONG TERM GOAL #2   Title Demo improved core strength/stability as evidenced by trunk flexion/extension of 4/5 to improve lifting mechanics to reduce risk for injury    Baseline 3-/5 flexion, 3+/5 trunk extension    Time 6    Period Weeks    Status Not Met                 Plan - 04/23/21 6256    Clinical Impression Statement Pt reports no  improvement in low back and LLE symptoms throughout course of tx.  Unable to centralize any radicular leg pain with flexion or extension-biased movements.  demonstrates marginal improvement in trunk strength and some improvement in flexion and left side bending ROM but with increase in radicular symptoms per report.  D/C from PT services at this time and pt is scheduled for additional MD visit for next level of care    Personal Factors and Comorbidities Age;Comorbidity 1;Past/Current Experience;Time since onset of injury/illness/exacerbation    Comorbidities obesity    Examination-Activity Limitations Bend;Carry;Lift;Stand;Stairs;Squat;Sit;Locomotion Level;Transfers    Examination-Participation Restrictions Cleaning;Community Activity;Driving;Yard Work;Occupation    Rehab Potential Good    PT Frequency 1x / week    PT Duration 6 weeks    PT Treatment/Interventions ADLs/Self Care Home Management;Aquatic Therapy;Biofeedback;Cryotherapy;Electrical Stimulation;DME Instruction;Traction;Moist Heat;Gait training;Stair training;Functional mobility training;Therapeutic activities;Therapeutic exercise;Balance training;Patient/family education;Neuromuscular re-education;Manual techniques;Passive range of motion;Energy conservation;Dry needling;Spinal Manipulations;Joint Manipulations    PT Next Visit Plan possibly D/C    PT Home Exercise Plan piriformis stretch (supine and sitting), ab set, sitting with good posture; 4/26: posterior pelvic tilt, SKTC 5/10 piriformis stretch, hip extension    Consulted and Agree with Plan of Care Patient           Patient will benefit from skilled therapeutic intervention in order to improve the following deficits and impairments:  Decreased activity tolerance,Decreased endurance,Decreased range of motion,Decreased strength,Difficulty walking,Increased muscle spasms,Impaired perceived functional ability,Postural dysfunction,Improper body mechanics,Obesity,Pain  Visit  Diagnosis: Chronic left-sided low back pain, unspecified whether sciatica present  Muscle weakness (generalized)     Problem List Patient Active Problem List   Diagnosis Date Noted  . Hyperlipidemia 11/27/2017  . Diastolic CHF ([place]) 38/93/7342  . Low testosterone 11/17/2016  . former smoker 7.5 pack years quit 1990 11/17/2016  . Morbid obesity ([place]) 11/26/2015  . Elevated blood sugar 11/26/2015  . Chronic low back pain 08/15/2014  . OSA (obstructive sleep apnea) 09/08/2013  . SOB (shortness of breath) on  exertion 06/16/2013  . Bright red rectal bleeding 06/16/2013  . 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 Essential hypertension, benign 03/05/2009   9:42 AM, 04/23/21 M. Sherlyn Lees, PT, DPT Physical Therapist- [place] Office Number: [phone]  [facility] [address] [place], [place], [address] Phone: [phone]   Fax:  [phone]  Name: NIKI COSMAN MRN: [id number] Date of Birth: Oct 13, 1961

## 2021-04-24 ENCOUNTER — Other Ambulatory Visit: Payer: Self-pay | Admitting: Family Medicine

## 2021-04-30 ENCOUNTER — Encounter (HOSPITAL_COMMUNITY): Payer: 59

## 2021-05-03 NOTE — Progress Notes (Signed)
I, Kenneth Berry, LAT, ATC, am serving as scribe for Dr. Lynne Leader.  Kenneth Berry is a 60 y.o. male who presents to [place] at [facility] today for f/u chronic L-sided LBP w/ sciatica. Pt was last seen by Dr. Georgina Snell on 03/19/21 and was advised to cont PT of which he's completed a total 6 visits. Today, pt reports that he feels about the same.  He locates his pain to his L lower back and is also reporting some pain in his neck and upper back.  He has been completing his HEP per PT.  Chronic pain LLB however with heavy duty activity RLB hurts worse than left.  Pain radiates down left leg to lateral calf with activity and walking on uneven ground.   Dx imaging: 02/05/21 L-spine & C-spine XR             06/23/06 C-spine XR             06/14/05 C-spine XR             12/12/03 C-spine & L-spine MRI   Pertinent review of systems: No fevers or chills  Relevant historical information: Hypertension.  History neck surgery.   Exam:  BP 104/72 (BP Location: Right Arm, Patient Position: Sitting, Cuff Size: Large)   Pulse (!) 106   Ht 5\' 10"  (1.778 m)   Wt 277 lb (125.6 kg)   SpO2 94%   BMI 39.75 kg/m  General: Well Developed, well nourished, and in no acute distress.   MSK: L-spine normal-appearing nontender midline.  Normal lumbar motion.    Lab and Radiology Results  EXAM: LUMBAR SPINE - 2-3 VIEW  COMPARISON:  None.  FINDINGS: Five non rib-bearing lumbar type vertebra. Alignment within normal limits. Vertebral body heights are maintained. Minimal degenerative changes at L2-L3 and L4-L5.  IMPRESSION: Minimal degenerative change. No acute osseous abnormality.   Electronically Signed   By: Donavan Foil M.D.   On: 02/05/2021 23:14  I, Lynne Leader, personally (independently) visualized and performed the interpretation of the images attached in this note.      Assessment and Plan: 60 y.o. male with low back pain with lumbar radiculopathy.  Patient  has chronic left low back pain all the time.  However his right low back hurts worse than his left with activity.  Additionally has left lumbar radiculopathy at the L5 dermatomal pattern with activity.  At this point he has failed physical therapy conservative management and home exercise program conservative management directed by me.  Plan for MRI for both facet injection planning an epidural steroid injection planning.  Recheck after MRI.  Discussed plan with patient who expresses understanding and agreement.  Additionally discussed potential injection plans and goals etc.   PDMP not reviewed this encounter. Orders Placed This Encounter  Procedures  . MR Lumbar Spine Wo Contrast    Standing Status:   Future    Standing Expiration Date:   05/07/2022    Order Specific Question:   What is the patient's sedation requirement?    Answer:   No Sedation    Order Specific Question:   Does the patient have a pacemaker or implanted devices?    Answer:   No    Order Specific Question:   Preferred imaging location?    Answer:   Product/process development scientist (table limit-350lbs)   No orders of the defined types were placed in this encounter.    Discussed warning signs or symptoms. Please see discharge  instructions. Patient expresses understanding.   The above documentation has been reviewed and is accurate and complete Lynne Leader, M.D.  Total encounter time 30 minutes including face-to-face time with the patient and, reviewing past medical record, and charting on the date of service.   Treatment plan options details about epidural steroid injections and facet injections and medial branch block and ablation.

## 2021-05-07 ENCOUNTER — Other Ambulatory Visit: Payer: Self-pay

## 2021-05-07 ENCOUNTER — Encounter: Payer: Self-pay | Admitting: Family Medicine

## 2021-05-07 ENCOUNTER — Ambulatory Visit (INDEPENDENT_AMBULATORY_CARE_PROVIDER_SITE_OTHER): Payer: 59 | Admitting: Family Medicine

## 2021-05-07 VITALS — BP 104/72 | HR 106 | Ht 70.0 in | Wt 277.0 lb

## 2021-05-07 DIAGNOSIS — G8929 Other chronic pain: Secondary | ICD-10-CM | POA: Diagnosis not present

## 2021-05-07 DIAGNOSIS — M5442 Lumbago with sciatica, left side: Secondary | ICD-10-CM | POA: Diagnosis not present

## 2021-05-07 NOTE — Patient Instructions (Signed)
Thank you for coming in today.  You should hear from MRI scheduling within 1 week. If you do not hear please let me know.   Recheck after the MRI to go over the results and plan for injections likely.    Epidural Steroid Injection  An epidural steroid injection is a shot of steroid medicine and numbing medicine that is given into the space between the spinal cord and the bones of the back (epidural space). The shot helps relieve pain caused by an irritated or swollen nerve root. The amount of pain relief you get from the injection depends on what is causing the nerve to be swollen and irritated, and how long your pain lasts. You are more likely to benefit from this injection if your pain is strong and comes on suddenly rather than if you have had long-term (chronic) pain. Tell a health care provider about:  Any allergies you have.  All medicines you are taking, including vitamins, herbs, eye drops, creams, and over-the-counter medicines.  Any problems you or family members have had with anesthetic medicines.  Any blood disorders you have.  Any surgeries you have had.  Any medical conditions you have.  Whether you are pregnant or may be pregnant. What are the risks? Generally, this is a safe procedure. However, problems may occur, including:  Headache.  Bleeding.  Infection.  Allergic reaction to medicines.  Nerve damage. What happens before the procedure? Staying hydrated Follow instructions from your health care provider about hydration, which may include:  Up to 2 hours before the procedure - you may continue to drink clear liquids, such as water, clear fruit juice, black coffee, and plain tea. Eating and drinking restrictions Follow instructions from your health care provider about eating and drinking, which may include:  8 hours before the procedure - stop eating heavy meals or foods, such as meat, fried foods, or fatty foods.  6 hours before the procedure - stop  eating light meals or foods, such as toast or cereal.  6 hours before the procedure - stop drinking milk or drinks that contain milk.  2 hours before the procedure - stop drinking clear liquids. Medicines  You may be given medicines to lower anxiety.  Ask your health care provider about: ? Changing or stopping your regular medicines. This is especially important if you are taking diabetes medicines or blood thinners. ? Taking medicines such as aspirin and ibuprofen. These medicines can thin your blood. Do not take these medicines unless your health care provider tells you to take them. ? Taking over-the-counter medicines, vitamins, herbs, and supplements. General instructions  Ask your health care provider what steps will be taken to prevent infection.  Plan to have a responsible adult take you home from the hospital or clinic.  If you will be going home right after the procedure, plan to have a responsible adult care for you for the time you are told. This is important. What happens during the procedure?  An IV will be inserted into one of your veins.  You will be given one or more of the following: ? A medicine to help you relax (sedative). ? A medicine to numb the area (local anesthetic).  You will be asked to lie on your abdomen or sit.  The injection site will be cleaned.  A needle will be inserted through your skin into the epidural space. This may cause you some discomfort. An X-ray machine will be used to guide the needle as close as  possible to the affected nerve.  A steroid medicine and a local anesthetic will be injected into the epidural space.  The needle and IV will be removed.  A bandage (dressing) will be put over the injection site. The procedure may vary among health care providers and hospitals. What can I expect after the procedure?  Your blood pressure, heart rate, breathing rate, and blood oxygen level will be monitored until you leave the hospital or  clinic.  Your arm or leg may feel weak or numb for a few hours.  The injection site may feel sore. Follow these instructions at home: Injection site care  You may remove the bandage (dressing) after 24 hours.  Check your injection site every day for signs of infection. Check for: ? Redness, swelling, or pain. ? Fluid or blood. ? Warmth. ? Pus or a bad smell. Managing pain, stiffness, and swelling  For 24 hours after the procedure: ? Avoid using heat on the injection site. ? Do not take baths, swim, or use a hot tub until your health care provider approves. Ask your health care provider if you may take showers. You may only be allowed to take sponge baths.   If directed, put ice on the injection site. To do this: ? Put ice in a plastic bag. ? Place a towel between your skin and the bag. ? Leave the ice on for 20 minutes, 2-3 times a day.   Activity  If you were given a sedative during the procedure, it can affect you for several hours. Do not drive or operate machinery until your health care provider says that it is safe.  Return to your normal activities as told by your health care provider. Ask your health care provider what activities are safe for you. General instructions  Take over-the-counter and prescription medicines only as told by your health care provider.  Drink enough fluid to keep your urine pale yellow.  Keep all follow-up visits as told by your health care provider. This is important. Contact a health care provider if:  You have any of these signs of infection: ? Redness, swelling, or pain around your injection site. ? Fluid or blood coming from your injection site. ? Warmth coming from your injection site. ? Pus or a bad smell coming from your injection site. ? A fever.  You continue to have pain and soreness around the injection site, even after taking over-the-counter pain medicine.  You have severe, sudden, or lasting nausea or vomiting. Get help  right away if:  You have severe pain at the injection site that is not relieved by medicines.  You develop a severe headache or a stiff neck.  You become sensitive to light.  You have any new numbness or weakness in your legs or arms.  You lose control of your bladder or bowel movements.  You have trouble breathing. Summary  An epidural steroid injection is a shot of steroid medicine and numbing medicine that is given into the epidural space.  The shot helps relieve pain caused by an irritated or swollen nerve root.  You are more likely to benefit from this injection if your pain is strong and comes on suddenly rather than if you have had chronic pain. This information is not intended to replace advice given to you by your health care provider. Make sure you discuss any questions you have with your health care provider. Document Revised: 03/23/2020 Document Reviewed: 06/06/2019 Elsevier Patient Education  [place].  Facet Joint Block The facet joints connect the bones of the spine (vertebrae). They make it possible for you to bend, twist, and make other movements with your spine. They also keep you from bending too far, twisting too far, and making other extreme movements. A facet joint block is a procedure in which a numbing medicine (anesthetic) is injected into a facet joint. In many cases, an anti-inflammatory medicine (steroid) is also injected. A facet joint block may be done:  To diagnose neck or back pain. If the pain gets better after a facet joint block, it means the pain is probably coming from the facet joint. If the pain does not get better, it means the pain is probably not coming from the facet joint.  To relieve neck or back pain that is caused by an inflamed facet joint. A facet joint block is only done to relieve pain if the pain does not improve with other methods, such as medicine, exercise programs, and physical therapy. Tell a health care provider  about:  Any allergies you have.  All medicines you are taking, including vitamins, herbs, eye drops, creams, and over-the-counter medicines.  Any problems you or family members have had with anesthetic medicines.  Any blood disorders you have.  Any surgeries you have had.  Any medical conditions you have or have had.  Whether you are pregnant or may be pregnant. What are the risks? Generally, this is a safe procedure. However, problems may occur, including:  Bleeding.  Injury to a nerve near the injection site.  Pain at the injection site.  Weakness or numbness in areas controlled by nerves near the injection site.  Infection.  Temporary fluid retention.  Allergic reactions to medicines or dyes.  Injury to other structures or organs near the injection site. What happens before the procedure? Medicines Ask your health care provider about:  Changing or stopping your regular medicines. This is especially important if you are taking diabetes medicines or blood thinners.  Taking medicines such as aspirin and ibuprofen. These medicines can thin your blood. Do not take these medicines unless your health care provider tells you to take them.  Taking over-the-counter medicines, vitamins, herbs, and supplements. Eating and drinking Follow instructions from your health care provider about eating and drinking, which may include:  8 hours before the procedure - stop eating heavy meals or foods, such as meat, fried foods, or fatty foods.  6 hours before the procedure - stop eating light meals or foods, such as toast or cereal.  6 hours before the procedure - stop drinking milk or drinks that contain milk.  2 hours before the procedure - stop drinking clear liquids. Staying hydrated Follow instructions from your health care provider about hydration, which may include:  Up to 2 hours before the procedure - you may continue to drink clear liquids, such as water, clear fruit  juice, black coffee, and plain tea. General instructions  Do not use any products that contain nicotine or tobacco for at least 4-6 weeks before the procedure. These products include cigarettes, e-cigarettes, and chewing tobacco. If you need help quitting, ask your health care provider.  Plan to have someone take you home from the hospital or clinic.  Ask your health care provider: ? How your surgery site will be marked. ? What steps will be taken to help prevent infection. These may include:  Removing hair at the surgery site.  Washing skin with a germ-killing soap.  Receiving antibiotic medicine. What happens  during the procedure?  You will put on a hospital gown.  You will lie on your stomach on an X-ray table. You may be asked to lie in a different position if an injection will be made in your neck.  Machines will be used to monitor your oxygen levels, heart rate, and blood pressure.  Your skin will be cleaned.  If an injection will be made in your neck, an IV will be inserted into one of your veins. Fluids and medicine will flow directly into your body through the IV.  A numbing medicine (local anesthetic) will be applied to your skin. Your skin may sting or burn for a moment.  A video X-ray machine (fluoroscopy) will be used to find the joint. In some cases, a CT scan may be used.  A contrast dye may be injected into the facet joint area to help find the joint.  When the joint is located, an anesthetic will be injected into the joint through the needle.  Your health care provider will ask you whether you feel pain relief. ? If you feel relief, a steroid may be injected to provide pain relief for a longer period of time. ? If you do not feel relief or feel only partial relief, additional injections of an anesthetic may be made in other facet joints.  The needle will be removed.  Your skin will be cleaned.  A bandage (dressing) will be applied over each injection  site. The procedure may vary among health care providers and hospitals.   What happens after the procedure?  Your blood pressure, heart rate, breathing rate, and blood oxygen level will be monitored until you leave the hospital or clinic.  You will lie down and rest for a period of time. Summary  A facet joint block is a procedure in which a numbing medicine (anesthetic) is injected into a facet joint. An anti-inflammatory medicine (stereoid) may also be injected.  Follow instructions from your health care provider about medicines and eating and drinking before the procedure.  Do not use any products that contain nicotine or tobacco for at least 4-6 weeks before the procedure.  You will lie on your stomach for the procedure, but you may be asked to lie in a different position if an injection will be made in your neck.  When the joint is located, an anesthetic will be injected into the joint through the needle. This information is not intended to replace advice given to you by your health care provider. Make sure you discuss any questions you have with your health care provider. Document Revised: 03/17/2019 Document Reviewed: 10/29/2018 Elsevier Patient Education  2021 [employer].

## 2021-05-08 ENCOUNTER — Telehealth: Payer: Self-pay | Admitting: Radiology

## 2021-05-08 MED ORDER — LORAZEPAM 0.5 MG PO TABS: ORAL_TABLET | ORAL | 0 refills | Status: DC

## 2021-05-08 NOTE — Telephone Encounter (Signed)
Ativan prescribed.

## 2021-05-09 NOTE — Telephone Encounter (Signed)
Spoke to pt and informed him rx was prescribed.

## 2021-05-12 ENCOUNTER — Other Ambulatory Visit: Payer: Self-pay

## 2021-05-12 ENCOUNTER — Ambulatory Visit (INDEPENDENT_AMBULATORY_CARE_PROVIDER_SITE_OTHER): Payer: 59

## 2021-05-12 DIAGNOSIS — G8929 Other chronic pain: Secondary | ICD-10-CM | POA: Diagnosis not present

## 2021-05-12 DIAGNOSIS — M5442 Lumbago with sciatica, left side: Secondary | ICD-10-CM | POA: Diagnosis not present

## 2021-05-13 NOTE — Progress Notes (Signed)
Medium facet arthritis at L4-L5 and possible pinched nerve at this level as well.  Injections for at the facet joint and epidural steroid injections could be done at this level.  Please return to clinic to go over the results of full detail.  We will plan for injections

## 2021-05-20 ENCOUNTER — Other Ambulatory Visit: Payer: Self-pay

## 2021-05-20 ENCOUNTER — Ambulatory Visit (INDEPENDENT_AMBULATORY_CARE_PROVIDER_SITE_OTHER): Payer: 59 | Admitting: Family Medicine

## 2021-05-20 VITALS — BP 127/73 | HR 90 | Ht 70.0 in | Wt 275.2 lb

## 2021-05-20 DIAGNOSIS — G8929 Other chronic pain: Secondary | ICD-10-CM | POA: Diagnosis not present

## 2021-05-20 DIAGNOSIS — M5442 Lumbago with sciatica, left side: Secondary | ICD-10-CM

## 2021-05-20 NOTE — Progress Notes (Signed)
I, Peterson Lombard, LAT, ATC acting as a scribe for Lynne Leader, MD.  Kenneth Berry is a 60 y.o. male who presents to [place] at [facility] today for f/u chronic L-sided LBP w/ sciatica. Pt was last seen by Dr. Georgina Snell on 05/07/21 and advised to proceed to MRI. Today, pt reports low back is feeling about the same. Pt notes radiating pain will vary from R to L leg and bilat. Pt notes increased pain and symptoms w/ increased activity.  Dx imaging: 05/12/21 L-spine MRI 02/05/21 L-spine & c-spine XR 06/23/06 C-spine XR             06/14/05 C-spine XR             12/12/03 C-spine & L-spine MRI  Pertinent review of systems: No fevers or chills  Relevant historical information: Hypertension   Exam:  BP 127/73 (BP Location: Right Arm, Patient Position: Sitting, Cuff Size: Normal)   Pulse 90   Ht 5\' 10"  (1.778 m)   Wt 275 lb 3.2 oz (124.8 kg)   SpO2 95%   BMI 39.49 kg/m  General: Well Developed, well nourished, and in no acute distress.   MSK: L-spine decreased range of motion    Lab and Radiology Results EXAM: MRI LUMBAR SPINE WITHOUT CONTRAST   TECHNIQUE: Multiplanar, multisequence MR imaging of the lumbar spine was performed. No intravenous contrast was administered.   COMPARISON:  None.   FINDINGS: Segmentation:  Standard.   Alignment:  Physiologic.   Vertebrae:  Multiple hemangiomas.  No compression fracture.   Conus medullaris and cauda equina: Conus extends to the L1 level. Conus and cauda equina appear normal.   Paraspinal and other soft tissues: Negative   Disc levels:   L1-L2: Normal disc space and facet joints. No spinal canal stenosis. No neural foraminal stenosis.   L2-L3: Small disc bulge. No spinal canal stenosis. No neural foraminal stenosis.   L3-L4: Small disc bulge. No spinal canal stenosis. Mild left neural foraminal stenosis.   L4-L5: Left asymmetric disc bulge. Moderate facet hypertrophy. Left lateral recess narrowing without  central spinal canal stenosis. Mild left neural foraminal stenosis.   L5-S1: Normal disc space and facet joints. No spinal canal stenosis. No neural foraminal stenosis.   Visualized sacrum: Normal.   IMPRESSION: 1. Mild left neural foraminal stenosis at L3-4 and L4-5. 2. Left lateral recess narrowing at L4-L5, which may serve as a source of left L5 radiculopathy. 3. No spinal canal stenosis.     Electronically Signed   By: Ulyses Jarred M.D.   On: 05/12/2021 22:31   I, Lynne Leader, personally (independently) visualized and performed the interpretation of the images attached in this note.     Assessment and Plan: 60 y.o. male with lumbar radiculopathy and low back pain.  Patient has left L5 lumbar radiculopathy and chronic low back pain thought to be due to L4-L5 facet DJD bilaterally.  After discussion of MRI results with patient discussed options.  Plan for trial of epidural steroid injection left-sided to target L5 radiculopathy.  If this helps his radicular symptoms but not his back pain we will proceed with facet injections at L4-L5.  Discussed treatment plan and options with patient who expresses understanding and agreement.  Total encounter time 20 minutes including face-to-face time with the patient and, reviewing past medical record, and charting on the date of service.      PDMP not reviewed this encounter. Orders Placed This Encounter  Procedures  DG INJECT DIAG/THERA/INC NEEDLE/CATH/PLC EPI/LUMB/SAC W/IMG    LUMB EPI #1 LEFT [id number] [facility] 275 LBS PACS (05/12/21) NO THINS/OTC 05/20/21 GAVE TO JENNIFER TO CHECK AUTH/CLC    Standing Status:   Future    Standing Expiration Date:   05/20/2022    Order Specific Question:   Reason for Exam (SYMPTOM  OR DIAGNOSIS REQUIRED)    Answer:   left L5 radiculopathy. Level and technique per radiology    Order Specific Question:   Preferred Imaging Location?    Answer:   [id number]    Order Specific Question:    Radiology Contrast Protocol - do NOT remove file path    Answer:   [email or web address]   No orders of the defined types were placed in this encounter.    Discussed warning signs or symptoms. Please see discharge instructions. Patient expresses understanding.   The above documentation has been reviewed and is accurate and complete Lynne Leader, M.D.

## 2021-05-20 NOTE — Patient Instructions (Signed)
Thank you for coming in today.   Please call [facility] at [phone] to schedule your spine injection.    Let me know how you feel a week or two after the injection.

## 2021-06-03 ENCOUNTER — Other Ambulatory Visit: Payer: Self-pay

## 2021-06-03 ENCOUNTER — Ambulatory Visit
Admission: RE | Admit: 2021-06-03 | Discharge: 2021-06-03 | Disposition: A | Discharge status: Home / Self Care | Payer: 59 | Source: Ambulatory Visit | Attending: Family Medicine | Admitting: Family Medicine

## 2021-06-03 DIAGNOSIS — M5442 Lumbago with sciatica, left side: Secondary | ICD-10-CM

## 2021-06-03 DIAGNOSIS — G8929 Other chronic pain: Secondary | ICD-10-CM

## 2021-06-03 MED ORDER — TRIAMCINOLONE ACETONIDE 40 MG/ML IJ SUSP (RADIOLOGY): 60.0000 mg | Freq: Once | INTRAMUSCULAR | Status: DC

## 2021-06-03 MED ORDER — IOPAMIDOL (ISOVUE-M 300) INJECTION 61%: 1.0000 mL | Freq: Once | INTRAMUSCULAR | Status: DC

## 2021-06-03 MED ORDER — IOPAMIDOL (ISOVUE-M 200) INJECTION 41%
1.0000 mL | Freq: Once | INTRAMUSCULAR | Status: AC
  Administered 2021-06-03: 1 mL via EPIDURAL

## 2021-06-03 MED ORDER — METHYLPREDNISOLONE ACETATE 40 MG/ML INJ SUSP (RADIOLOG
80.0000 mg | Freq: Once | INTRAMUSCULAR | Status: AC
  Administered 2021-06-03: 80 mg via EPIDURAL

## 2021-06-03 NOTE — Discharge Instructions (Signed)

## 2021-07-23 NOTE — Progress Notes (Incomplete)
Phone [phone] In person visit   Subjective:   Kenneth Berry is a 60 y.o. year old very pleasant male patient who presents for/with See problem oriented charting No chief complaint on file.   This visit occurred during the SARS-CoV-2 public health emergency.  Safety protocols were in place, including screening questions prior to the visit, additional usage of staff PPE, and extensive cleaning of exam room while observing appropriate contact time as indicated for disinfecting solutions.   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 Do not drive with this medicine. 4 tablet 0   sildenafil (REVATIO) 20 MG tablet Take 2-5 tablets as needed once every 48 hours for erectile dysfunction 50 tablet 3   No current facility-administered medications for this visit.     Objective:  There were no vitals taken for this visit. Gen: NAD, resting comfortably CV: RRR no murmurs rubs or gallops Lungs: CTAB no crackles, wheeze, rhonchi Abdomen: soft/nontender/nondistended/normal bowel sounds. No rebound or guarding.  Ext: no edema Skin: warm,  dry Neuro: grossly normal, moves all extremities  ***    Assessment and Plan  #hyperlipidemia S: Medication:*** none. 10 year ascvd risk as high as 11.7%.  -considering coronary calcium scoring -working on lifestyle changes Lab Results  Component Value Date   CHOL 165 01/29/2021   HDL 35.30 (L) 01/29/2021   LDLCALC 110 (H) 01/29/2021   LDLDIRECT 86.0 08/12/2016   TRIG 99.0 01/29/2021   CHOLHDL 5 01/29/2021  A/P: ***  #hypertension #Diastolic CHF- with SOB but cardiology has thought primarily deconditioning. Has also seen pulmonary in the past.  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 Also history of fusion in cervical spine with Dr. Carloyn Berry  -PT and chiropractor trials nto helpful A/P: ***   *** Health Maintenance Due  Topic Date Due   Pneumococcal Vaccine 62-29 Years old (1 - PCV) Never done   COVID-19 Vaccine (3 - Pfizer risk series) 04/27/2020   INFLUENZA VACCINE  07/08/2021    Recommended follow up: No follow-ups on file. Future Appointments  Date Time Provider [place]  07/30/2021  8:00 AM Kenneth Olp, MD [facility]    Lab/Order associations: No diagnosis found.  No orders of the defined types were placed in this encounter.  I,Kenneth Berry,acting as a Education administrator for Kenneth Reddish, MD.,have documented all relevant documentation on the behalf of Kenneth Huffman, MD,as directed by  Kenneth Reddish, MD while in the  presence of Kenneth Reddish, MD.    ***  Return precautions advised.  Kenneth Berry

## 2021-07-30 ENCOUNTER — Ambulatory Visit: Payer: 59 | Admitting: Family Medicine

## 2021-07-30 DIAGNOSIS — I10 Essential (primary) hypertension: Secondary | ICD-10-CM

## 2021-07-30 DIAGNOSIS — R739 Hyperglycemia, unspecified: Secondary | ICD-10-CM

## 2021-07-30 DIAGNOSIS — E785 Hyperlipidemia, unspecified: Secondary | ICD-10-CM

## 2021-07-30 DIAGNOSIS — G8929 Other chronic pain: Secondary | ICD-10-CM

## 2021-08-12 ENCOUNTER — Other Ambulatory Visit: Payer: Self-pay | Admitting: Family Medicine

## 2021-11-02 IMAGING — XA Imaging study
2 series · 2 of 2 positions shown · non-contrast
Comparison: none

CLINICAL DATA: Lumbosacral spondylosis without myelopathy. Low back
pain. Left L5 radiculopathy. Left-sided lateral recess and neural
foraminal stenosis at L4-5.

[Series 1: ortho adipose · 1 of 1 slices shown (1 of 2)]
[im 1/1]
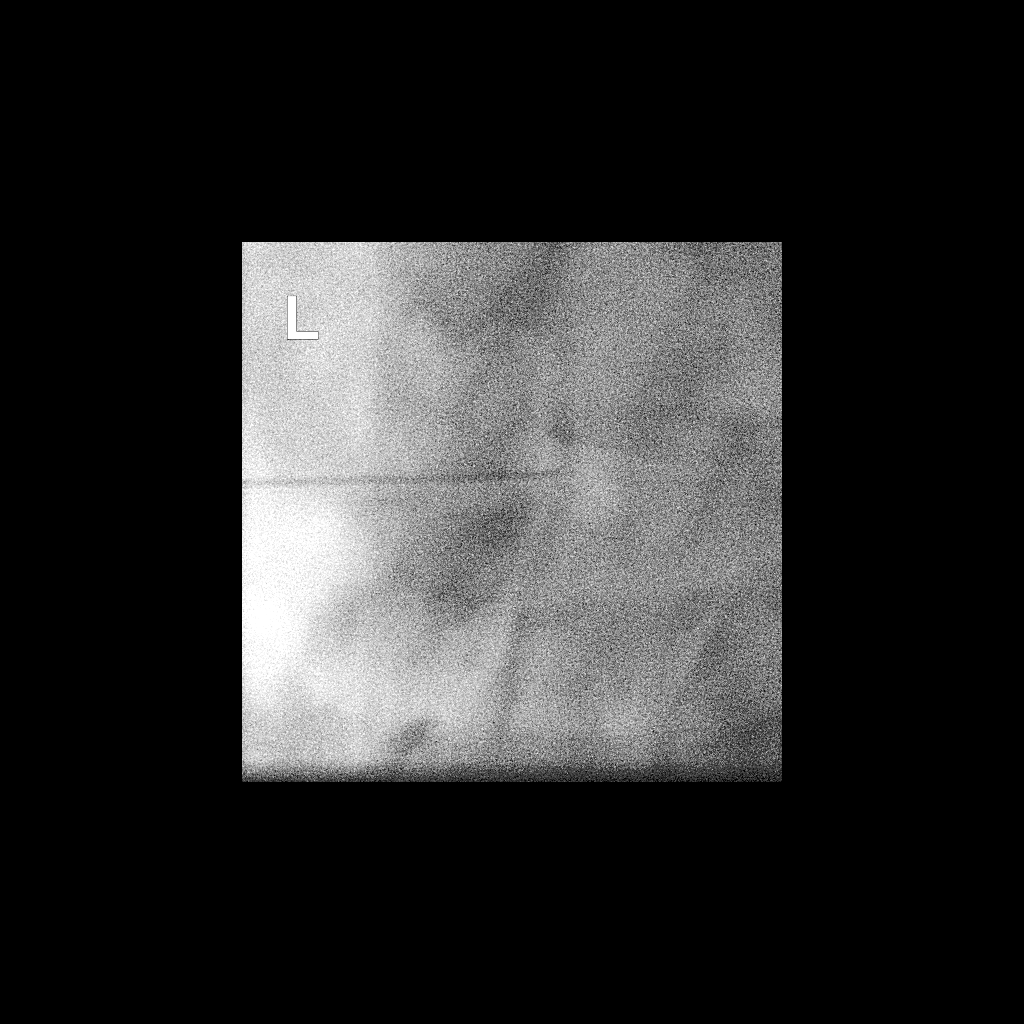

[Series 2: ortho adipose · 1 of 1 slices shown (2 of 2)]
[im 1/1]
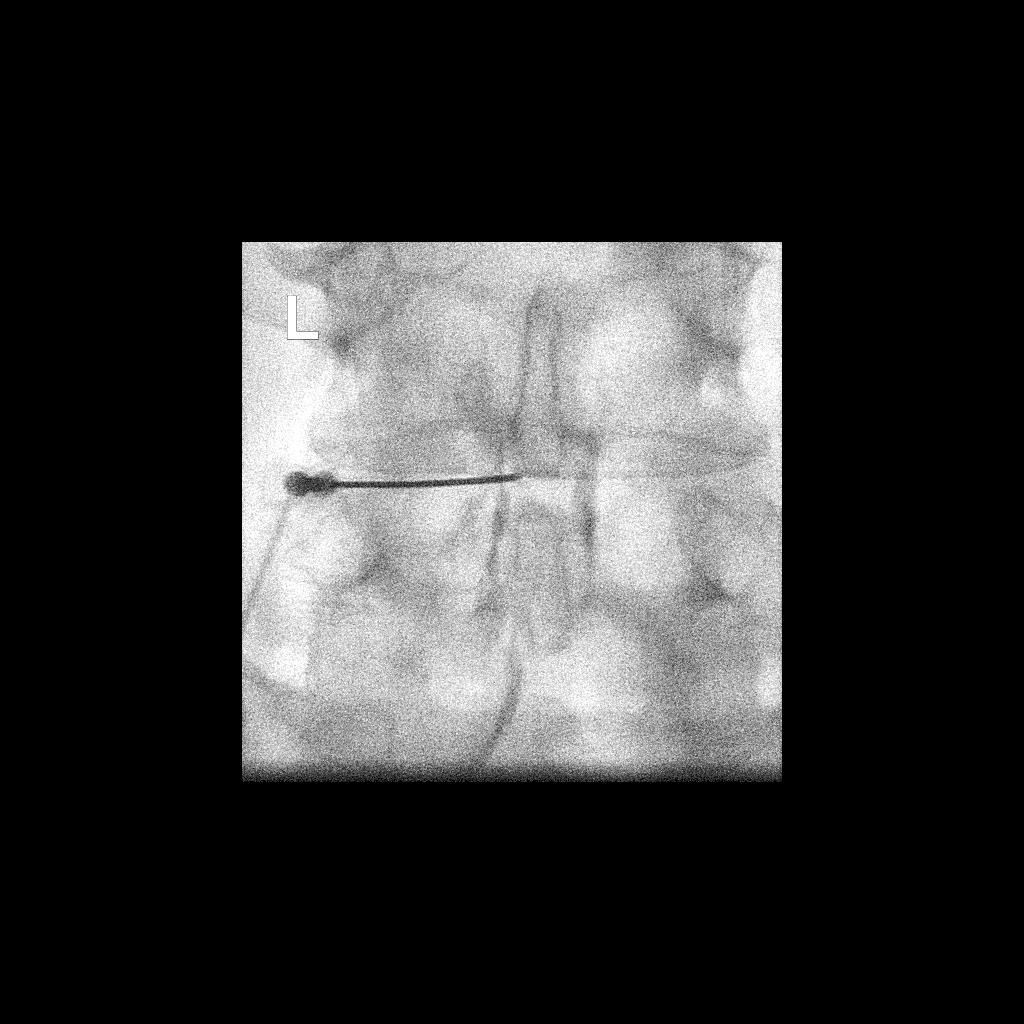

[2 of 2 positions shown; findings below may reference images not displayed]

FLUOROSCOPY TIME:  Fluoroscopy Time: 25 seconds

Radiation Exposure Index: 30.85 microGray*m^2

PROCEDURE:
The procedure, risks, benefits, and alternatives were explained to
the patient. Questions regarding the procedure were encouraged and
answered. The patient understands and consents to the procedure.

LUMBAR EPIDURAL INJECTION:

An interlaminar approach was performed on the left at L4-5. The
overlying skin was cleansed and anesthetized. A 3.5 inch 20 gauge
epidural needle was advanced using loss-of-resistance technique.

DIAGNOSTIC EPIDURAL INJECTION:

Injection of Isovue-M 200 shows a good epidural pattern with spread
above and below the level of needle placement, bilaterally but
greater on the left. No vascular opacification is seen.

THERAPEUTIC EPIDURAL INJECTION:

80 mg of Depo-Medrol mixed with 3 mL of 1% lidocaine were instilled.
The procedure was well-tolerated, and the patient was discharged
thirty minutes following the injection in good condition.

COMPLICATIONS:
None
IMPRESSION: Technically successful interlaminar epidural injection on the left
at L4-5.

## 2021-12-18 ENCOUNTER — Other Ambulatory Visit: Payer: Self-pay | Admitting: Family Medicine

## 2022-01-15 NOTE — Progress Notes (Signed)
Phone [phone] In person visit   Subjective:   Kenneth Berry is a 61 y.o. year old very pleasant male patient who presents for/with See problem oriented charting Chief Complaint  Patient presents with   Follow-up   Hypertension    Pt needs lisinopril and lasix refilled.   Hyperlipidemia   Hyperglycemia   This visit occurred during the SARS-CoV-2 public health emergency.  Safety protocols were in place, including screening questions prior to the visit, additional usage of staff PPE, and extensive cleaning of exam room while observing appropriate contact time as indicated for disinfecting solutions.   Past Medical History-  Patient Active Problem List   Diagnosis Date Noted   Diastolic CHF ([place]) 09/81/1914    Priority: High   Hyperlipidemia 11/27/2017    Priority: Medium    Low testosterone 11/17/2016    Priority: Medium    Elevated blood sugar 11/26/2015    Priority: Medium    Chronic low back pain 08/15/2014    Priority: Medium    OSA (obstructive sleep apnea) 09/08/2013    Priority: Medium    SOB (shortness of breath) on exertion 06/16/2013    Priority: Medium    Bright red rectal bleeding 06/16/2013    Priority: Medium    Essential hypertension, benign 03/05/2009    Priority: Medium    former smoker 7.5 pack years quit 1990 11/17/2016    Priority: Low   Morbid obesity ([place]) 11/26/2015    Priority: Low   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 90 tablet 3   lisinopril (ZESTRIL) 10 MG tablet Take 1/2 (one-half) tablet by mouth once daily 45 tablet 3   No current facility-administered medications for this visit.     Objective:  BP  108/60    Pulse 94    Temp (!) 97.3 F (36.3 C)    Ht 5\' 10"  (1.778 m)    Wt 284 lb 6.4 oz (129 kg)    SpO2 94%    BMI 40.81 kg/m  Gen: NAD, resting comfortably CV: RRR no murmurs rubs or gallops Lungs: CTAB no crackles, wheeze, rhonchi Ext: trace to 1+ edema Skin: warm, dry    Assessment and Plan   #hyperlipidemia S: Medication: None-. 10 year ascvd risk as high as 11.7%. - 8.1%  -considered coronary calcium scoring- has opted out so far -was working on lifestyle changes Lab Results  Component Value Date   CHOL 165 01/29/2021   HDL 35.30 (L) 01/29/2021   LDLCALC 110 (H) 01/29/2021   LDLDIRECT 86.0 08/12/2016   TRIG 99.0 01/29/2021   CHOLHDL 5 01/29/2021   A/P: Poor control last check-he is open to proceeding with CT cardiac scoring and with prior LDL over 70-consider statin  #hypertension #Diastolic CHF- with SOB but cardiology has thought primarily deconditioning. Has also seen pulmonary in the past.  S: medication: lisinopril 10Mg  daily (1/2 tablet daily)  , lasix 20 mg daily Edema: no increase- stale Weight gain:some Shortness of breath: no increase- stable Orthopnea/PND: none BP Readings from Last 3 Encounters:  01/16/22 108/60  06/03/21 130/75  05/20/21 127/73  A/P: bp well controlled. CHF reasonably controlled vs simply deconditioning- continue current  meds   # Obesity morbid with BMI over 40 S: Unfortunately weight is trending up Wt Readings from Last 3 Encounters:  01/16/22 284 lb 6.4 oz (129 kg)  05/20/21 275 lb 3.2 oz (124.8 kg)  05/07/21 277 lb (125.6 kg)  A/P: Patient is aware of weight gain-we discussed/Encouraged need for healthy eating, regular exercise, weight loss.    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 Also history of fusion in cervical spine with Dr. Carloyn Manner  -PT minimally helpful. Chiropractor minimal help0 intermittent -worked with Dr. Georgina Snell through 05/20/21 - had epidural injection with no help. Does not want to pursue surgery and not clear would be best candidate A/P: ongoing issues- wants to hold off on further intervention or pain medicine.   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 Future Appointments  Date Time Provider [place]  03/07/2022  2:15 PM [facility]  05/22/2022  2:40 PM Sandy Blouch, Brayton Mars, MD [facility]   Lab/Order associations:   ICD-10-CM   1. Essential hypertension, benign  I10     2. Hyperlipidemia, unspecified hyperlipidemia type  E78.5 CT CARDIAC SCORING (SELF PAY ONLY)    3. Hyperglycemia  R73.9     4. Chronic diastolic congestive heart failure (HCC)  I50.32     5. Chronic left-sided low back pain with left-sided sciatica  M54.42    G89.29     6. Morbid obesity (HCC) Chronic E66.01       Meds ordered this encounter  Medications   sildenafil (REVATIO) 20 MG tablet    Sig: Take 2-5 tablets as needed once every 48 hours for erectile dysfunction    Dispense:  50 tablet    Refill:  3   furosemide (LASIX) 20 MG tablet    Sig: Take 1 tablet (20 mg total) by mouth every morning.    Dispense:  90 tablet    Refill:  3   lisinopril (ZESTRIL) 10 MG tablet    Sig: Take 1/2 (one-half) tablet by mouth once daily    Dispense:  45 tablet    Refill:  3   I,Jada Bradford,acting as a scribe for Garret Reddish, MD.,have documented all  relevant documentation on the behalf of Garret Reddish, MD,as directed by  Garret Reddish, MD while in the presence of Garret Reddish, MD.   I, Garret Reddish, MD, have reviewed all documentation for this visit. The documentation on 01/16/22 for the exam, diagnosis, procedures, and orders are all accurate and complete.   Return precautions advised.  Garret Reddish, MD

## 2022-01-16 ENCOUNTER — Ambulatory Visit (INDEPENDENT_AMBULATORY_CARE_PROVIDER_SITE_OTHER): Payer: 59 | Admitting: Family Medicine

## 2022-01-16 ENCOUNTER — Other Ambulatory Visit: Payer: Self-pay

## 2022-01-16 ENCOUNTER — Encounter: Payer: Self-pay | Admitting: Family Medicine

## 2022-01-16 VITALS — BP 108/60 | HR 94 | Temp 97.3°F | Ht 70.0 in | Wt 284.4 lb

## 2022-01-16 DIAGNOSIS — E785 Hyperlipidemia, unspecified: Secondary | ICD-10-CM

## 2022-01-16 DIAGNOSIS — R739 Hyperglycemia, unspecified: Secondary | ICD-10-CM | POA: Diagnosis not present

## 2022-01-16 DIAGNOSIS — G8929 Other chronic pain: Secondary | ICD-10-CM

## 2022-01-16 DIAGNOSIS — I5032 Chronic diastolic (congestive) heart failure: Secondary | ICD-10-CM | POA: Diagnosis not present

## 2022-01-16 DIAGNOSIS — M5442 Lumbago with sciatica, left side: Secondary | ICD-10-CM

## 2022-01-16 DIAGNOSIS — I10 Essential (primary) hypertension: Secondary | ICD-10-CM | POA: Diagnosis not present

## 2022-01-16 MED ORDER — LISINOPRIL 10 MG PO TABS: ORAL_TABLET | ORAL | 3 refills | Status: DC

## 2022-01-16 MED ORDER — FUROSEMIDE 20 MG PO TABS: 20.0000 mg | ORAL_TABLET | Freq: Every morning | ORAL | 3 refills | Status: DC

## 2022-01-16 MED ORDER — SILDENAFIL CITRATE 20 MG PO TABS: ORAL_TABLET | ORAL | 3 refills | Status: DC

## 2022-01-16 NOTE — Patient Instructions (Addendum)
We will update full blood work in June. Lets work on Oceanographer exercise/weight loss through that visit  We will call you within two weeks about your referral to CT cardiac scoring. If you do not hear within 2 weeks, give [place] a call.    Recommended follow up: Return for next already scheduled visit- in june.

## 2022-03-07 ENCOUNTER — Ambulatory Visit
Admission: RE | Admit: 2022-03-07 | Discharge: 2022-03-07 | Disposition: A | Discharge status: Home / Self Care | Payer: Self-pay | Source: Ambulatory Visit | Attending: Family Medicine | Admitting: Family Medicine

## 2022-03-07 DIAGNOSIS — E785 Hyperlipidemia, unspecified: Secondary | ICD-10-CM

## 2022-05-22 ENCOUNTER — Encounter: Payer: Self-pay | Admitting: Family Medicine

## 2022-05-22 ENCOUNTER — Ambulatory Visit (INDEPENDENT_AMBULATORY_CARE_PROVIDER_SITE_OTHER): Payer: 59 | Admitting: Family Medicine

## 2022-05-22 VITALS — BP 117/79 | HR 84 | Temp 98.6°F | Ht 70.0 in | Wt 275.1 lb

## 2022-05-22 DIAGNOSIS — R739 Hyperglycemia, unspecified: Secondary | ICD-10-CM

## 2022-05-22 DIAGNOSIS — I10 Essential (primary) hypertension: Secondary | ICD-10-CM

## 2022-05-22 DIAGNOSIS — Z125 Encounter for screening for malignant neoplasm of prostate: Secondary | ICD-10-CM | POA: Diagnosis not present

## 2022-05-22 DIAGNOSIS — Z87891 Personal history of nicotine dependence: Secondary | ICD-10-CM

## 2022-05-22 DIAGNOSIS — E785 Hyperlipidemia, unspecified: Secondary | ICD-10-CM | POA: Diagnosis not present

## 2022-05-22 DIAGNOSIS — Z0000 Encounter for general adult medical examination without abnormal findings: Secondary | ICD-10-CM

## 2022-05-22 LAB — POC URINALSYSI DIPSTICK (AUTOMATED)
Bilirubin, UA: NEGATIVE
Blood, UA: NEGATIVE
Glucose, UA: NEGATIVE
Ketones, UA: NEGATIVE
Leukocytes, UA: NEGATIVE
Nitrite, UA: NEGATIVE
Protein, UA: NEGATIVE
Spec Grav, UA: >=1.03 — AB (ref 1.010–1.025)
Urobilinogen, UA: 0.2 E.U./dL
pH, UA: 5.5 (ref 5.0–8.0)

## 2022-05-22 MED ORDER — SILDENAFIL CITRATE 20 MG PO TABS: ORAL_TABLET | ORAL | 3 refills | Status: DC

## 2022-05-22 MED ORDER — LISINOPRIL 10 MG PO TABS: ORAL_TABLET | ORAL | 3 refills | Status: DC

## 2022-05-22 MED ORDER — FUROSEMIDE 20 MG PO TABS: 20.0000 mg | ORAL_TABLET | Freq: Every morning | ORAL | 3 refills | Status: DC

## 2022-05-22 NOTE — Patient Instructions (Addendum)
Please stop by lab before you go If you have mychart- we will send your results within 3 business days of [place] receiving them.  If you do not have mychart- we will call you about results within 5 business days of [place] receiving them.  *please also note that you will see labs on mychart as soon as they post. I will later go in and write notes on them- will say "notes from Dr. Yong Channel"   Lets wait to order testosterone until we have regular labs back  Recommended follow up: Return in about 6 months (around 11/21/2022) for followup or sooner if needed.Schedule b4 you leave.

## 2022-05-22 NOTE — Progress Notes (Signed)
Phone: [phone]   Subjective:  Patient presents today for their annual physical. Chief complaint-noted.   See problem oriented charting- ROS- full  review of systems was completed and negative  except for: intermittent cramps left arm and hand with no clear reason- hes not sure why- staying hydrated. Can be about 15 minutes, ongoing back issues, low energy  The following were reviewed and entered/updated in epic: Past Medical History:  Diagnosis Date   Allergy    seasonal spring the worst    Arthritis    spine,neck   Hemorrhoid    occasionally bleeds    Hypertension    Sleep apnea    wears cpap   Patient Active Problem List   Diagnosis Date Noted   Diastolic CHF ([place]) 00/86/7619    Priority: High   Hyperlipidemia 11/27/2017    Priority: Medium    Low testosterone 11/17/2016    Priority: Medium    Elevated blood sugar 11/26/2015    Priority: Medium    Chronic low back pain 08/15/2014    Priority: Medium    OSA (obstructive sleep apnea) 09/08/2013    Priority: Medium    SOB (shortness of breath) on exertion 06/16/2013    Priority: Medium    Bright red rectal bleeding 06/16/2013    Priority: Medium    Essential hypertension, benign 03/05/2009    Priority: Medium    former smoker 7.5 pack years quit 1990 11/17/2016    Priority: Low   Morbid obesity ([place]) 11/26/2015    Priority: Low   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    Priority: Low   Past Surgical History:  Procedure Laterality Date   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   Neck fusion     TONSILLECTOMY AND ADENOIDECTOMY     UVULOPALATOPHARYNGOPLASTY      Family History  Problem Relation Age of Onset   Arrhythmia Father        atrial flutter   Heart failure Father        83   Parkinson's disease Father        since 90s   Other Mother        42 years old healthy   Cancer Maternal Grandfather        lung   Bipolar disorder Sister        full   Healthy Brother        full    Healthy Brother        half brother- neck and back issues   Healthy Brother        half brother   Colon cancer Neg Hx    Colon polyps Neg Hx    Esophageal cancer Neg Hx    Rectal cancer Neg Hx    Stomach cancer Neg Hx     Medications- reviewed and updated Current Outpatient Medications  Medication Sig Dispense Refill   furosemide (LASIX) 20 MG tablet Take 1 tablet (20 mg total) by mouth every morning. 90 tablet 3   lisinopril (ZESTRIL) 10 MG tablet Take 1/2 (one-half) tablet by mouth once daily 45 tablet 3   sildenafil (REVATIO) 20 MG tablet Take 2-5 tablets as needed once every 48 hours for erectile dysfunction 50 tablet 3   No current facility-administered medications for this visit.    Allergies-reviewed and updated No Known Allergies  Social History   Social History Narrative   Married. 2 adopted children ( adopted son at 3.5 months, daughter- sibling to the son). [place]  son interested in music and 15 in 2019.       [employer]- produce      Hobbies: video games  (CPU- plays skyrim as of 2017)      Thinking about biking- no regular exercise   Objective  Objective:  BP 117/79 (BP Location: Left Arm, Patient Position: Sitting, Cuff Size: Large)   Pulse 84   Temp 98.6 F (37 C) (Temporal)   Ht '5\' 10"'$  (1.778 m)   Wt 275 lb 2 oz (124.8 kg)   SpO2 95%   BMI 39.48 kg/m  Gen: NAD, resting comfortably HEENT: Mucous membranes are moist. Oropharynx normal Neck: no thyromegaly CV: RRR no murmurs rubs or gallops Lungs: CTAB no crackles, wheeze, rhonchi Abdomen: soft/nontender/nondistended/normal bowel sounds. No rebound or guarding.  Ext: no edema Skin: warm, dry Neuro: grossly normal, moves all extremities, PERRLA    Assessment and Plan  61 y.o. male presenting for annual physical.  Health Maintenance counseling: 1. Anticipatory guidance: Patient counseled regarding regular dental exams -q6 months, eye exams -yearly,  avoiding smoking and second hand smoke , limiting  alcohol to 2 beverages per day - doesn't drink, no illicit drugs.   2. Risk factor reduction:  Advised patient of need for regular exercise and diet rich and fruits and vegetables to reduce risk of heart attack and stroke.  Exercise- active on farm still.  Diet/weight management-weight stable from last year but 9 lbs down from February- has been intentional  more low carb Wt Readings from Last 3 Encounters:  05/22/22 275 lb 2 oz (124.8 kg)  01/16/22 284 lb 6.4 oz (129 kg)  05/20/21 275 lb 3.2 oz (124.8 kg)  3. Immunizations/screenings/ancillary studies Immunization History  Administered Date(s) Administered   Influenza,inj,Quad PF,6+ Mos 08/15/2014, 11/26/2015, 11/17/2016, 10/06/2017, 12/07/2018, 12/12/2019, 09/04/2020   PFIZER(Purple Top)SARS-COV-2 Vaccination 03/01/2020, 03/30/2020   Rabies, IM 05/03/2016, 05/06/2016, 05/10/2016, 05/17/2016   Td 12/08/2001   Tdap 08/15/2014   Zoster Recombinat (Shingrix) 12/07/2018, 04/11/2019  4. Prostate cancer screening- will check psa- low risk prior trend.  Wants to defer rectal  Lab Results  Component Value Date   PSA 0.74 12/12/2019   PSA 0.52 12/07/2018   PSA 0.52 11/27/2017   5. Colon cancer screening - 01/16/16 with 10 year repeat 6. Skin cancer screening- no dermatologist recently. advised regular sunscreen use. Denies worrisome, changing, or new skin lesions.  7. Smoking associated screening (lung cancer screening, AAA screen 65-75, UA)- former smoker- quit in 1990- consider AAA at age 87 8. STD screening - only active with wife  Status of chronic or acute concerns   #Left low back pain-working with Dr. Georgina Snell in 2022 at that time had upcoming steroid injection planned for radicular symptoms.  Injection didn't help but basically just wants to deal with current level pain. Tramadol didn't help in past  #hyperlipidemia with 03/07/22 Coronary calcium score of 2. This was 32nd percentile for age S: Medication: none. working on lifestyle  changes Lab Results  Component Value Date   CHOL 165 01/29/2021   HDL 35.30 (L) 01/29/2021   LDLCALC 110 (H) 01/29/2021   LDLDIRECT 86.0 08/12/2016   TRIG 99.0 01/29/2021   CHOLHDL 5 01/29/2021  A/P: he would like to check levels and leans toward improving diet/continuing efforts for exercise and recheck ct cardiac likely 5 years.   #Dilation of ascending aorta S: Mild dilation of ascending aorta, measures approximately 83m at the mid ascending aorta measured in an axial plane on 03/07/22 CT A/P:offered CT angiogram  or cardiothoracic referral- he wants to check CT angiogram next year and hold off on referral  #hypertension #Diastolic CHF- with SOB but cardiology has thought primarily deconditioning. Has also seen pulmonary in the past.  S: medication: lisinopril '5Mg'$ , lasix 20 mg Shortness of breath: stable Edema: stable Weight gain: weight has gone down A/P: BLood pressure well controlled- no signs worsening CHF- continue current meds  # Hyperglycemia/insulin resistance/prediabetes- cbgs elevated but a1c max of 5.6  S:  Medication:  Lab Results  Component Value Date   HGBA1C 5.6 09/04/2020  A/P: he wants a1c- will check with labs  #Low testosterone S: medications: none- prior had bene on androgel with Dr. Sherren Mocha- insurance ended up not covering but interested to recheck -has noted worsening strength- possible loss of muscle mass- within the last year - also drop In libido in last year -ongoing issues with erections but not worse Lab Results  Component Value Date   TESTOSTERONE 190 (L) 12/15/2016  A/P: history of Low T- when we checked 2021 free testosterone low normal and total was low normal- we are going ot recheck fasting and do LH and [place] as well especially with prior more significant lows  Recommended follow up: Return in about 6 months (around 11/21/2022) for followup or sooner if needed.Schedule b4 you leave.  Lab/Order associations: fasting   ICD-10-CM   1.  Preventative health care  Z00.00 CBC with Differential/Platelet    Comprehensive metabolic panel    Lipid panel    PSA    Hemoglobin A1c    POCT Urinalysis Dipstick (Automated)    2. Essential hypertension, benign  I10 CBC with Differential/Platelet    Comprehensive metabolic panel    Lipid panel    3. Hyperlipidemia, unspecified hyperlipidemia type  E78.5 CBC with Differential/Platelet    Comprehensive metabolic panel    Lipid panel    4. Elevated blood sugar  R73.9 Hemoglobin A1c    5. Former smoker  Z87.891 POCT Urinalysis Dipstick (Automated)    6. Screening for prostate cancer  Z12.5 PSA      Meds ordered this encounter  Medications   sildenafil (REVATIO) 20 MG tablet    Sig: Take 2-5 tablets as needed once every 48 hours for erectile dysfunction    Dispense:  50 tablet    Refill:  3   furosemide (LASIX) 20 MG tablet    Sig: Take 1 tablet (20 mg total) by mouth every morning.    Dispense:  90 tablet    Refill:  3   lisinopril (ZESTRIL) 10 MG tablet    Sig: Take 1/2 (one-half) tablet by mouth once daily    Dispense:  45 tablet    Refill:  3    Return precautions advised.  Garret Reddish, MD

## 2022-05-23 LAB — PSA: PSA: 0.59 ng/mL (ref 0.10–4.00)

## 2022-05-23 LAB — CBC WITH DIFFERENTIAL/PLATELET
Basophils Absolute: 0.1 10*3/uL (ref 0.0–0.1)
Basophils Relative: 0.8 % (ref 0.0–3.0)
Eosinophils Absolute: 0.1 10*3/uL (ref 0.0–0.7)
Eosinophils Relative: 1.3 % (ref 0.0–5.0)
HCT: 44.2 % (ref 39.0–52.0)
Hemoglobin: 14.6 g/dL (ref 13.0–17.0)
Lymphocytes Relative: 23.3 % (ref 12.0–46.0)
Lymphs Abs: 2.3 10*3/uL (ref 0.7–4.0)
MCHC: 32.9 g/dL (ref 30.0–36.0)
MCV: 87.5 fl (ref 78.0–100.0)
Monocytes Absolute: 0.5 10*3/uL (ref 0.1–1.0)
Monocytes Relative: 5.3 % (ref 3.0–12.0)
Neutro Abs: 6.7 10*3/uL (ref 1.4–7.7)
Neutrophils Relative %: 69.3 % (ref 43.0–77.0)
Platelets: 200 10*3/uL (ref 150.0–400.0)
RBC: 5.05 Mil/uL (ref 4.22–5.81)
RDW: 15.1 % (ref 11.5–15.5)
WBC: 9.7 10*3/uL (ref 4.0–10.5)

## 2022-05-23 LAB — COMPREHENSIVE METABOLIC PANEL
ALT: 23 U/L (ref 0–53)
AST: 17 U/L (ref 0–37)
Albumin: 4.5 g/dL (ref 3.5–5.2)
Alkaline Phosphatase: 52 U/L (ref 39–117)
BUN: 15 mg/dL (ref 6–23)
CO2: 28 mEq/L (ref 19–32)
Calcium: 9.5 mg/dL (ref 8.4–10.5)
Chloride: 103 mEq/L (ref 96–112)
Creatinine, Ser: 1.09 mg/dL (ref 0.40–1.50)
GFR: 73.52 mL/min (ref >60.00)
Glucose, Bld: 94 mg/dL (ref 70–99)
Potassium: 4.6 mEq/L (ref 3.5–5.1)
Sodium: 141 mEq/L (ref 135–145)
Total Bilirubin: 0.7 mg/dL (ref 0.2–1.2)
Total Protein: 7 g/dL (ref 6.0–8.3)

## 2022-05-23 LAB — LIPID PANEL
Cholesterol: 156 mg/dL (ref 0–200)
HDL: 32.9 mg/dL — ABNORMAL LOW (ref >39.00)
LDL Cholesterol: 90 mg/dL (ref 0–99)
NonHDL: 123.3
Total CHOL/HDL Ratio: 5
Triglycerides: 168 mg/dL — ABNORMAL HIGH (ref 0.0–149.0)
VLDL: 33.6 mg/dL (ref 0.0–40.0)

## 2022-05-23 LAB — HEMOGLOBIN A1C: Hgb A1c MFr Bld: 5.6 % (ref 4.6–6.5)

## 2022-09-01 ENCOUNTER — Encounter: Payer: Self-pay | Admitting: *Deleted

## 2022-11-20 ENCOUNTER — Encounter: Payer: Self-pay | Admitting: *Deleted

## 2022-11-24 ENCOUNTER — Ambulatory Visit: Payer: 59 | Admitting: Family Medicine

## 2022-12-09 ENCOUNTER — Encounter: Payer: Self-pay | Admitting: Family Medicine

## 2022-12-09 ENCOUNTER — Ambulatory Visit (INDEPENDENT_AMBULATORY_CARE_PROVIDER_SITE_OTHER): Payer: 59 | Admitting: Family Medicine

## 2022-12-09 VITALS — BP 122/60 | HR 95 | Temp 98.4°F | Ht 70.0 in | Wt 288.4 lb

## 2022-12-09 DIAGNOSIS — R252 Cramp and spasm: Secondary | ICD-10-CM | POA: Diagnosis not present

## 2022-12-09 DIAGNOSIS — I5032 Chronic diastolic (congestive) heart failure: Secondary | ICD-10-CM

## 2022-12-09 DIAGNOSIS — R739 Hyperglycemia, unspecified: Secondary | ICD-10-CM | POA: Diagnosis not present

## 2022-12-09 DIAGNOSIS — Z23 Encounter for immunization: Secondary | ICD-10-CM | POA: Diagnosis not present

## 2022-12-09 DIAGNOSIS — I77810 Thoracic aortic ectasia: Secondary | ICD-10-CM

## 2022-12-09 DIAGNOSIS — E785 Hyperlipidemia, unspecified: Secondary | ICD-10-CM | POA: Diagnosis not present

## 2022-12-09 DIAGNOSIS — I10 Essential (primary) hypertension: Secondary | ICD-10-CM | POA: Diagnosis not present

## 2022-12-09 DIAGNOSIS — R7989 Other specified abnormal findings of blood chemistry: Secondary | ICD-10-CM

## 2022-12-09 LAB — CBC WITH DIFFERENTIAL/PLATELET
Basophils Absolute: 0.1 10*3/uL (ref 0.0–0.1)
Basophils Relative: 0.8 % (ref 0.0–3.0)
Eosinophils Absolute: 0.1 10*3/uL (ref 0.0–0.7)
Eosinophils Relative: 1.3 % (ref 0.0–5.0)
HCT: 44.2 % (ref 39.0–52.0)
Hemoglobin: 15 g/dL (ref 13.0–17.0)
Lymphocytes Relative: 18.4 % (ref 12.0–46.0)
Lymphs Abs: 1.6 10*3/uL (ref 0.7–4.0)
MCHC: 33.9 g/dL (ref 30.0–36.0)
MCV: 84.5 fl (ref 78.0–100.0)
Monocytes Absolute: 0.6 10*3/uL (ref 0.1–1.0)
Monocytes Relative: 6.3 % (ref 3.0–12.0)
Neutro Abs: 6.5 10*3/uL (ref 1.4–7.7)
Neutrophils Relative %: 73.2 % (ref 43.0–77.0)
Platelets: 208 10*3/uL (ref 150.0–400.0)
RBC: 5.23 Mil/uL (ref 4.22–5.81)
RDW: 14 % (ref 11.5–15.5)
WBC: 8.9 10*3/uL (ref 4.0–10.5)

## 2022-12-09 LAB — MAGNESIUM: Magnesium: 2.2 mg/dL (ref 1.5–2.5)

## 2022-12-09 LAB — COMPREHENSIVE METABOLIC PANEL
ALT: 25 U/L (ref 0–53)
AST: 18 U/L (ref 0–37)
Albumin: 4.3 g/dL (ref 3.5–5.2)
Alkaline Phosphatase: 53 U/L (ref 39–117)
BUN: 16 mg/dL (ref 6–23)
CO2: 27 mEq/L (ref 19–32)
Calcium: 9.6 mg/dL (ref 8.4–10.5)
Chloride: 103 mEq/L (ref 96–112)
Creatinine, Ser: 0.95 mg/dL (ref 0.40–1.50)
GFR: 86.38 mL/min (ref >60.00)
Glucose, Bld: 113 mg/dL — ABNORMAL HIGH (ref 70–99)
Potassium: 5 mEq/L (ref 3.5–5.1)
Sodium: 139 mEq/L (ref 135–145)
Total Bilirubin: 0.4 mg/dL (ref 0.2–1.2)
Total Protein: 6.9 g/dL (ref 6.0–8.3)

## 2022-12-09 LAB — TSH: TSH: 2.68 u[IU]/mL (ref 0.35–5.50)

## 2022-12-09 LAB — HEMOGLOBIN A1C: Hgb A1c MFr Bld: 5.9 % (ref 4.6–6.5)

## 2022-12-09 LAB — FOLLICLE STIMULATING HORMONE: FSH: 6.1 m[IU]/mL (ref 1.4–18.1)

## 2022-12-09 LAB — LUTEINIZING HORMONE: LH: 4.51 m[IU]/mL (ref 1.50–9.30)

## 2022-12-09 NOTE — Patient Instructions (Addendum)
Please stop by lab before you go If you have mychart- we will send your results within 3 business days of [place] receiving them.  If you do not have mychart- we will call you about results within 5 business days of [place] receiving them.  *please also note that you will see labs on mychart as soon as they post. I will later go in and write notes on them- will say "notes from Dr. Yong Channel"   We will call you within two weeks about your referral for CT angiogram of aorta  through [facility].  Their phone number is [phone].  Please call them if you have not heard in 1-2 weeks  Goal 150 minutes exercise a week- can gradual move toward this  Recommended follow up: Return for next already scheduled visit or sooner if needed.

## 2022-12-09 NOTE — Progress Notes (Signed)
Phone [phone] In person visit   Subjective:   Kenneth Berry is a 62 y.o. year old very pleasant male patient who presents for/with See problem oriented charting Chief Complaint  Patient presents with   Follow-up    Pt would like to have testosterone level checked.   Hypertension   cramping    Pt c/o having cramps in the bilateral arms and left leg and sides that come and go.    Past Medical History-  Patient Active Problem List   Diagnosis Date Noted   Diastolic CHF ([place]) 41/58/3094    Priority: High   Hyperlipidemia 11/27/2017    Priority: Medium    Low testosterone 11/17/2016    Priority: Medium    Elevated blood sugar 11/26/2015    Priority: Medium    Chronic low back pain 08/15/2014    Priority: Medium    OSA (obstructive sleep apnea) 09/08/2013    Priority: Medium    SOB (shortness of breath) on exertion 06/16/2013    Priority: Medium    Bright red rectal bleeding 06/16/2013    Priority: Medium    Essential hypertension, benign 03/05/2009    Priority: Medium    former smoker 7.5 pack years quit 1990 11/17/2016    Priority: Low   Morbid obesity ([place]) 11/26/2015    Priority: Low   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 90 tablet 3   lisinopril (ZESTRIL) 10 MG tablet Take 1/2 (one-half) tablet by mouth once daily 45 tablet 3   sildenafil (REVATIO) 20 MG tablet Take 2-5 tablets as needed once every 48 hours for erectile dysfunction 50 tablet 3   No current facility-administered medications for this visit.     Objective:  BP 122/60   Pulse 95   Temp 98.4 F (36.9 C)   Ht '5\' 10"'$  (1.778 m)   Wt 288 lb 6.4 oz (130.8 kg)   SpO2 95%   BMI 41.38 kg/m  Gen: NAD, resting comfortably CV: RRR no murmurs rubs or gallops Lungs: CTAB no crackles, wheeze,  rhonchi Abdomen: soft/nontender/nondistended/normal bowel sounds.  Ext: minimal edema Skin: warm, dry     Assessment and Plan   #hyperlipidemia with 03/07/22 Coronary calcium score of 2. This was 32nd percentile for age S: Medication: none. 10 year ascvd risk as high as 11.7%.  -working on lifestyle changes previously but has fallen back over winter -doesn't drink alcohol Lab Results  Component Value Date   CHOL 156 05/22/2022   HDL 32.90 (L) 05/22/2022   LDLCALC 90 05/22/2022   LDLDIRECT 86.0 08/12/2016   TRIG 168.0 (H) 05/22/2022   CHOLHDL 5 05/22/2022  A/P: Lipids slightly above goal- prefer LDL under 70 and triglycerides under 150- discussed healthy changes and recheck I 6 months  #Dilation of ascending aorta S: Mild dilation of ascending aorta, measures approximately 74m at the mid ascending aorta measured in an axial plane on 03/07/22 CT A/P: Last visit he preferred to not see cardiothoracic surgery -Today offered CT angiogram chest/aorta for annual follow-up-he agrees and this was ordered today -Discussed risk of incidental findings   #hypertension #Diastolic CHF- with SOB but cardiology has thought primarily deconditioning. Has also seen pulmonary in the past.  S: medication: lisinopril '10Mg'$ -takes half tablet, lasix 20 mg -getting some muscle cramps despite staying better hydrated-  can happen in ribs or down into legs Shortness of breath: none Edema: none Weight gain: Weight up 4 pounds from last February and 13 pounds from last June BP Readings from Last 3 Encounters:  12/09/22 122/60  05/22/22 117/79  01/16/22 108/60   A/P: hypertension - stable- continue current medicines  CHF- stable- continue current medicines  -also with some leg cramping- wonder if lasix contributing- check cmp and magnesium  # Hyperglycemia/insulin resistance/prediabetes- cbgs elevated but a1c max of 5.6  #morbid obesity- noted with BMI over 40 S:  Medication: none Exercise and diet- has  not been as good lately Lab Results  Component Value Date   HGBA1C 5.6 05/22/2022   HGBA1C 5.6 09/04/2020   HGBA1C 5.5 12/07/2018  A/P: with recent weigh gain recheck a1c  # Chronic bilateral low back pain S: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  Also history of fusion in cervical spine with Dr. Carloyn Manner   -PT and chiropractor trials nto helpful -injections with Dr. Georgina Snell, pain management visits and narcotics not helpful either A/P: plans to tolerate and focus on weight loss   #Low testosterone- see note 05/22/22 we had planned testosterone, FSH, LH and consider endocrine referral if low-it was an afternoon visit and preferred to do fasting today. No chest pain or shortness of breath  -low libido and ED issues noted.  -has had more fatigue lately. Wears CPAP consistently -also check tsh  Recommended follow up: Return for next already scheduled visit or sooner if needed. Future Appointments  Date Time Provider [place]  06/01/2023  8:00 AM Marin Olp, MD [facility]   Lab/Order associations:   ICD-10-CM   1. Chronic diastolic congestive heart failure (HCC)  I50.32     2. Low testosterone  R79.89 Testosterone Total,Free,Bio, Males-([facility])    [place]    LH    3. Hyperlipidemia, unspecified hyperlipidemia type  E78.5 CBC with Differential/Platelet    Comprehensive metabolic panel    TSH    4. Essential hypertension, benign  I10     5. Ascending aorta dilatation (HCC)  I77.810 CT ANGIO CHEST AORTA W/CM & OR WO/CM    6. Morbid obesity (HCC) Chronic E66.01     7. Elevated blood sugar  R73.9 Hemoglobin A1c    8. Need for immunization against influenza  Z23 Flu Vaccine QUAD 58moIM (Fluarix, Fluzone & Alfiuria Quad PF)    9. Leg cramping  R25.2 Magnesium     No orders of the defined types were placed in this encounter.  Return precautions advised.  SGarret Reddish MD

## 2022-12-10 ENCOUNTER — Encounter: Payer: Self-pay | Admitting: Family Medicine

## 2022-12-10 LAB — TESTOSTERONE TOTAL,FREE,BIO, MALES
Albumin: 4.4 g/dL (ref 3.6–5.1)
Sex Hormone Binding: 20 nmol/L — ABNORMAL LOW (ref 22–77)
Testosterone: 141 ng/dL — ABNORMAL LOW (ref 250–827)

## 2022-12-11 ENCOUNTER — Other Ambulatory Visit: Payer: Self-pay

## 2022-12-11 DIAGNOSIS — R7989 Other specified abnormal findings of blood chemistry: Secondary | ICD-10-CM

## 2022-12-15 ENCOUNTER — Telehealth: Payer: Self-pay | Admitting: Family Medicine

## 2022-12-15 NOTE — Telephone Encounter (Signed)
See below

## 2022-12-15 NOTE — Telephone Encounter (Signed)
Donald Prose from [facility] states Patient is scheduled for CT of Chest on 12/16/22 at 1 pm, however, CT requires a PA.  Donald Prose states if PA is received by 9 am tomorrow (12/16/22), Patient will be able to get CT done tomorrow, if not, appointment will be cancelled.

## 2022-12-16 ENCOUNTER — Ambulatory Visit
Admission: RE | Admit: 2022-12-16 | Discharge: 2022-12-16 | Disposition: A | Discharge status: Home / Self Care | Payer: 59 | Source: Ambulatory Visit | Attending: Family Medicine | Admitting: Family Medicine

## 2022-12-16 DIAGNOSIS — I77810 Thoracic aortic ectasia: Secondary | ICD-10-CM

## 2022-12-16 MED ORDER — IOPAMIDOL (ISOVUE-370) INJECTION 76%
75.0000 mL | Freq: Once | INTRAVENOUS | Status: AC | PRN
  Administered 2022-12-16: 75 mL via INTRAVENOUS

## 2023-01-20 ENCOUNTER — Other Ambulatory Visit: Payer: Self-pay

## 2023-01-20 DIAGNOSIS — R7989 Other specified abnormal findings of blood chemistry: Secondary | ICD-10-CM

## 2023-02-11 DIAGNOSIS — E7800 Pure hypercholesterolemia, unspecified: Secondary | ICD-10-CM | POA: Diagnosis not present

## 2023-02-11 DIAGNOSIS — R7303 Prediabetes: Secondary | ICD-10-CM | POA: Diagnosis not present

## 2023-02-11 DIAGNOSIS — I5022 Chronic systolic (congestive) heart failure: Secondary | ICD-10-CM | POA: Diagnosis not present

## 2023-02-11 DIAGNOSIS — E291 Testicular hypofunction: Secondary | ICD-10-CM | POA: Diagnosis not present

## 2023-02-11 DIAGNOSIS — N529 Male erectile dysfunction, unspecified: Secondary | ICD-10-CM | POA: Diagnosis not present

## 2023-02-11 DIAGNOSIS — I10 Essential (primary) hypertension: Secondary | ICD-10-CM | POA: Diagnosis not present

## 2023-02-11 DIAGNOSIS — G4733 Obstructive sleep apnea (adult) (pediatric): Secondary | ICD-10-CM | POA: Diagnosis not present

## 2023-02-17 DIAGNOSIS — M62830 Muscle spasm of back: Secondary | ICD-10-CM | POA: Diagnosis not present

## 2023-02-17 DIAGNOSIS — M9901 Segmental and somatic dysfunction of cervical region: Secondary | ICD-10-CM | POA: Diagnosis not present

## 2023-02-17 DIAGNOSIS — M9903 Segmental and somatic dysfunction of lumbar region: Secondary | ICD-10-CM | POA: Diagnosis not present

## 2023-02-17 DIAGNOSIS — M5136 Other intervertebral disc degeneration, lumbar region: Secondary | ICD-10-CM | POA: Diagnosis not present

## 2023-02-17 DIAGNOSIS — E291 Testicular hypofunction: Secondary | ICD-10-CM | POA: Diagnosis not present

## 2023-03-03 DIAGNOSIS — M5136 Other intervertebral disc degeneration, lumbar region: Secondary | ICD-10-CM | POA: Diagnosis not present

## 2023-03-03 DIAGNOSIS — M9901 Segmental and somatic dysfunction of cervical region: Secondary | ICD-10-CM | POA: Diagnosis not present

## 2023-03-03 DIAGNOSIS — M62830 Muscle spasm of back: Secondary | ICD-10-CM | POA: Diagnosis not present

## 2023-03-03 DIAGNOSIS — M9903 Segmental and somatic dysfunction of lumbar region: Secondary | ICD-10-CM | POA: Diagnosis not present

## 2023-03-17 DIAGNOSIS — M9903 Segmental and somatic dysfunction of lumbar region: Secondary | ICD-10-CM | POA: Diagnosis not present

## 2023-03-17 DIAGNOSIS — M9901 Segmental and somatic dysfunction of cervical region: Secondary | ICD-10-CM | POA: Diagnosis not present

## 2023-03-17 DIAGNOSIS — M5136 Other intervertebral disc degeneration, lumbar region: Secondary | ICD-10-CM | POA: Diagnosis not present

## 2023-03-17 DIAGNOSIS — M62830 Muscle spasm of back: Secondary | ICD-10-CM | POA: Diagnosis not present

## 2023-03-20 DIAGNOSIS — E291 Testicular hypofunction: Secondary | ICD-10-CM | POA: Diagnosis not present

## 2023-03-31 DIAGNOSIS — M5136 Other intervertebral disc degeneration, lumbar region: Secondary | ICD-10-CM | POA: Diagnosis not present

## 2023-03-31 DIAGNOSIS — M9903 Segmental and somatic dysfunction of lumbar region: Secondary | ICD-10-CM | POA: Diagnosis not present

## 2023-03-31 DIAGNOSIS — M9901 Segmental and somatic dysfunction of cervical region: Secondary | ICD-10-CM | POA: Diagnosis not present

## 2023-03-31 DIAGNOSIS — M62830 Muscle spasm of back: Secondary | ICD-10-CM | POA: Diagnosis not present

## 2023-04-14 DIAGNOSIS — M9903 Segmental and somatic dysfunction of lumbar region: Secondary | ICD-10-CM | POA: Diagnosis not present

## 2023-04-14 DIAGNOSIS — M5136 Other intervertebral disc degeneration, lumbar region: Secondary | ICD-10-CM | POA: Diagnosis not present

## 2023-04-14 DIAGNOSIS — M62830 Muscle spasm of back: Secondary | ICD-10-CM | POA: Diagnosis not present

## 2023-04-14 DIAGNOSIS — M9901 Segmental and somatic dysfunction of cervical region: Secondary | ICD-10-CM | POA: Diagnosis not present

## 2023-04-21 DIAGNOSIS — E291 Testicular hypofunction: Secondary | ICD-10-CM | POA: Diagnosis not present

## 2023-04-28 DIAGNOSIS — M9903 Segmental and somatic dysfunction of lumbar region: Secondary | ICD-10-CM | POA: Diagnosis not present

## 2023-04-28 DIAGNOSIS — M9901 Segmental and somatic dysfunction of cervical region: Secondary | ICD-10-CM | POA: Diagnosis not present

## 2023-04-28 DIAGNOSIS — M5136 Other intervertebral disc degeneration, lumbar region: Secondary | ICD-10-CM | POA: Diagnosis not present

## 2023-04-28 DIAGNOSIS — M62830 Muscle spasm of back: Secondary | ICD-10-CM | POA: Diagnosis not present

## 2023-05-12 DIAGNOSIS — M5136 Other intervertebral disc degeneration, lumbar region: Secondary | ICD-10-CM | POA: Diagnosis not present

## 2023-05-12 DIAGNOSIS — M9901 Segmental and somatic dysfunction of cervical region: Secondary | ICD-10-CM | POA: Diagnosis not present

## 2023-05-12 DIAGNOSIS — M9903 Segmental and somatic dysfunction of lumbar region: Secondary | ICD-10-CM | POA: Diagnosis not present

## 2023-05-12 DIAGNOSIS — M62830 Muscle spasm of back: Secondary | ICD-10-CM | POA: Diagnosis not present

## 2023-05-25 ENCOUNTER — Encounter: Payer: 59 | Admitting: Family Medicine

## 2023-05-26 DIAGNOSIS — M5136 Other intervertebral disc degeneration, lumbar region: Secondary | ICD-10-CM | POA: Diagnosis not present

## 2023-05-26 DIAGNOSIS — M9903 Segmental and somatic dysfunction of lumbar region: Secondary | ICD-10-CM | POA: Diagnosis not present

## 2023-05-26 DIAGNOSIS — M62830 Muscle spasm of back: Secondary | ICD-10-CM | POA: Diagnosis not present

## 2023-05-26 DIAGNOSIS — M9901 Segmental and somatic dysfunction of cervical region: Secondary | ICD-10-CM | POA: Diagnosis not present

## 2023-06-01 ENCOUNTER — Encounter: Payer: 59 | Admitting: Family Medicine

## 2023-06-03 ENCOUNTER — Encounter: Payer: Self-pay | Admitting: Family Medicine

## 2023-06-03 ENCOUNTER — Ambulatory Visit (INDEPENDENT_AMBULATORY_CARE_PROVIDER_SITE_OTHER): Payer: 59 | Admitting: Family Medicine

## 2023-06-03 VITALS — BP 120/72 | HR 90 | Temp 97.3°F | Ht 70.0 in | Wt 255.2 lb

## 2023-06-03 DIAGNOSIS — Z125 Encounter for screening for malignant neoplasm of prostate: Secondary | ICD-10-CM | POA: Diagnosis not present

## 2023-06-03 DIAGNOSIS — I10 Essential (primary) hypertension: Secondary | ICD-10-CM

## 2023-06-03 DIAGNOSIS — Z131 Encounter for screening for diabetes mellitus: Secondary | ICD-10-CM | POA: Diagnosis not present

## 2023-06-03 DIAGNOSIS — E785 Hyperlipidemia, unspecified: Secondary | ICD-10-CM

## 2023-06-03 DIAGNOSIS — Z0000 Encounter for general adult medical examination without abnormal findings: Secondary | ICD-10-CM

## 2023-06-03 DIAGNOSIS — R7989 Other specified abnormal findings of blood chemistry: Secondary | ICD-10-CM | POA: Diagnosis not present

## 2023-06-03 DIAGNOSIS — R739 Hyperglycemia, unspecified: Secondary | ICD-10-CM

## 2023-06-03 DIAGNOSIS — K4090 Unilateral inguinal hernia, without obstruction or gangrene, not specified as recurrent: Secondary | ICD-10-CM

## 2023-06-03 DIAGNOSIS — I5032 Chronic diastolic (congestive) heart failure: Secondary | ICD-10-CM

## 2023-06-03 LAB — COMPREHENSIVE METABOLIC PANEL
ALT: 20 U/L (ref 0–53)
AST: 17 U/L (ref 0–37)
Albumin: 4.1 g/dL (ref 3.5–5.2)
Alkaline Phosphatase: 52 U/L (ref 39–117)
BUN: 17 mg/dL (ref 6–23)
CO2: 26 mEq/L (ref 19–32)
Calcium: 9.1 mg/dL (ref 8.4–10.5)
Chloride: 103 mEq/L (ref 96–112)
Creatinine, Ser: 0.85 mg/dL (ref 0.40–1.50)
GFR: 93.46 mL/min (ref >60.00)
Glucose, Bld: 103 mg/dL — ABNORMAL HIGH (ref 70–99)
Potassium: 3.6 mEq/L (ref 3.5–5.1)
Sodium: 136 mEq/L (ref 135–145)
Total Bilirubin: 0.6 mg/dL (ref 0.2–1.2)
Total Protein: 6.6 g/dL (ref 6.0–8.3)

## 2023-06-03 LAB — CBC WITH DIFFERENTIAL/PLATELET
Basophils Absolute: 0 10*3/uL (ref 0.0–0.1)
Basophils Relative: 0.7 % (ref 0.0–3.0)
Eosinophils Absolute: 0.1 10*3/uL (ref 0.0–0.7)
Eosinophils Relative: 1.2 % (ref 0.0–5.0)
HCT: 43 % (ref 39.0–52.0)
Hemoglobin: 14.1 g/dL (ref 13.0–17.0)
Lymphocytes Relative: 21.3 % (ref 12.0–46.0)
Lymphs Abs: 1.2 10*3/uL (ref 0.7–4.0)
MCHC: 32.8 g/dL (ref 30.0–36.0)
MCV: 82.5 fl (ref 78.0–100.0)
Monocytes Absolute: 0.6 10*3/uL (ref 0.1–1.0)
Monocytes Relative: 9.9 % (ref 3.0–12.0)
Neutro Abs: 3.8 10*3/uL (ref 1.4–7.7)
Neutrophils Relative %: 66.9 % (ref 43.0–77.0)
Platelets: 166 10*3/uL (ref 150.0–400.0)
RBC: 5.21 Mil/uL (ref 4.22–5.81)
RDW: 15.1 % (ref 11.5–15.5)
WBC: 5.7 10*3/uL (ref 4.0–10.5)

## 2023-06-03 LAB — PSA: PSA: 0.68 ng/mL (ref 0.10–4.00)

## 2023-06-03 LAB — TESTOSTERONE: Testosterone: 278.34 ng/dL — ABNORMAL LOW (ref 300.00–890.00)

## 2023-06-03 LAB — LIPID PANEL
Cholesterol: 155 mg/dL (ref 0–200)
HDL: 30.5 mg/dL — ABNORMAL LOW (ref >39.00)
LDL Cholesterol: 107 mg/dL — ABNORMAL HIGH (ref 0–99)
NonHDL: 124.4
Total CHOL/HDL Ratio: 5
Triglycerides: 89 mg/dL (ref 0.0–149.0)
VLDL: 17.8 mg/dL (ref 0.0–40.0)

## 2023-06-03 LAB — HEMOGLOBIN A1C: Hgb A1c MFr Bld: 5.5 % (ref 4.6–6.5)

## 2023-06-03 LAB — TSH: TSH: 2.81 u[IU]/mL (ref 0.35–5.50)

## 2023-06-03 NOTE — Patient Instructions (Addendum)
Please stop by lab before you go If you have mychart- we will send your results within 3 business days of [place] receiving them.  If you do not have mychart- we will call you about results within 5 business days of [place] receiving them.  *please also note that you will see labs on mychart as soon as they post. I will later go in and write notes on them- will say "notes from Dr. Durene Cal"   We have placed a referral for you today to [facility]- you can call them directly if you you have not heard within a week. Reach out to [place] if you are not able to get in contact with them within 2 weeks  See handout for balanitis treatment  Recommended follow up: Return in about 6 months (around 12/03/2023) for followup or sooner if needed.Schedule b4 you leave.

## 2023-06-03 NOTE — Progress Notes (Signed)
Phone: [phone]   Subjective:  Patient presents today for their annual physical. Chief complaint-noted.   See problem oriented charting- ROS- full  review of systems was completed and negative  except for: right hip pain , stable leg swelling, occasional hemorrhoidal bleeding, fatigue  The following were reviewed and entered/updated in epic: Past Medical History:  Diagnosis Date   Allergy    seasonal spring the worst    Arthritis    spine,neck   Hemorrhoid    occasionally bleeds    Hypertension    Sleep apnea    wears cpap   Patient Active Problem List   Diagnosis Date Noted   Diastolic CHF (HCC) 11/17/2016    Priority: High   Hyperlipidemia 11/27/2017    Priority: Medium    Low testosterone 11/17/2016    Priority: Medium    Elevated blood sugar 11/26/2015    Priority: Medium    Chronic low back pain 08/15/2014    Priority: Medium    OSA (obstructive sleep apnea) 09/08/2013    Priority: Medium    SOB (shortness of breath) on exertion 06/16/2013    Priority: Medium    Bright red rectal bleeding 06/16/2013    Priority: Medium    Essential hypertension, benign 03/05/2009    Priority: Medium    former smoker 7.5 pack years quit 1990 11/17/2016    Priority: Low   Morbid obesity (HCC) 11/26/2015    Priority: Low   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    Priority: Low   Past Surgical History:  Procedure Laterality Date   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   Neck fusion     TONSILLECTOMY AND ADENOIDECTOMY     UVULOPALATOPHARYNGOPLASTY      Family History  Problem Relation Age of Onset   Arrhythmia Father        atrial flutter   Heart failure Father        61   Parkinson's disease Father        since 42s   Other Mother        73 years old healthy   Cancer Maternal Grandfather        lung   Bipolar disorder Sister        full   Healthy Brother        full   Healthy Brother        half brother- neck and back issues   Healthy  Brother        half brother   Colon cancer Neg Hx    Colon polyps Neg Hx    Esophageal cancer Neg Hx    Rectal cancer Neg Hx    Stomach cancer Neg Hx    Medications- reviewed and updated Current Outpatient Medications  Medication Sig Dispense Refill   furosemide (LASIX) 20 MG tablet Take 1 tablet (20 mg total) by mouth every morning. 90 tablet 3   lisinopril (ZESTRIL) 10 MG tablet Take 1/2 (one-half) tablet by mouth once daily 45 tablet 3   sildenafil (REVATIO) 20 MG tablet Take 2-5 tablets as needed once every 48 hours for erectile dysfunction 50 tablet 3   No current facility-administered medications for this visit.    Allergies-reviewed and updated No Known Allergies  Social History   Social History Narrative   Married. 2 adopted children ( adopted son at 3.5 months, daughter- sibling to the son). 18 son interested in music and 15 in 2019.       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 Oropharynx normal Neck: no thyromegaly CV: RRR no murmurs rubs or gallops Lungs: CTAB no crackles, wheeze, rhonchi Abdomen: soft/nontender/nondistended/normal bowel sounds. No rebound or guarding.  Ext: no edema Skin: warm, dry Neuro: grossly normal, moves all extremities, PERRLA GU: in right groin note 3 x 2 cm bulge that worsens with valsalva concerning for hernia. Penis with mild erythema/scaling around base of glans- likely balanitis    Assessment and Plan  62 y.o. male presenting for annual physical.  Health Maintenance counseling: 1. Anticipatory guidance: Patient counseled regarding regular dental exams -q6 months, eye exams -yearly or 1.5 years,  avoiding smoking and  second hand smoke, limiting alcohol to 2 beverages per day - does not drink, no illicit drugs .   2. Risk factor reduction:  Advised patient of need for regular exercise and diet rich and fruits and vegetables to reduce risk of heart attack and stroke.  Exercise- remains active with work- hard time and energy wise to do more outside of that.  Diet/weight management-down 20 lbs from last year! And 33 lbs from January. Low carb worked well last year. Did carnivore for month or month and half and has added some greens and collards from garden at [place] lately.  Wt Readings from Last 3 Encounters:  06/03/23 255 lb 3.2 oz (115.8 kg)  12/09/22 288 lb 6.4 oz (130.8 kg)  05/22/22 275 lb 2 oz (124.8 kg)  3. Immunizations/screenings/ancillary studies- declines COVID vaccine , otherwise up to date  Immunization History  Administered Date(s) Administered   Influenza,inj,Quad PF,6+ Mos 08/15/2014, 11/26/2015, 11/17/2016, 10/06/2017, 12/07/2018, 12/12/2019, 09/04/2020, 12/09/2022   PFIZER(Purple Top)SARS-COV-2 Vaccination 03/01/2020, 03/30/2020   Rabies, IM 05/03/2016, 05/06/2016, 05/10/2016, 05/17/2016   Td 12/08/2001   Tdap 08/15/2014   Zoster Recombinat (Shingrix) 12/07/2018, 04/11/2019  4. Prostate cancer screening- low risk PSA trend- continue to monitor with labs especially on testosterone Lab Results  Component Value Date   PSA 0.59 05/22/2022   PSA 0.74 12/12/2019   PSA 0.52 12/07/2018   5. Colon cancer screening - 01/16/16 with 10 year repeat 6. Skin cancer screening- no dermatologist. advised regular sunscreen use. Denies worrisome, changing, or new skin lesions.  7. Smoking associated screening (lung cancer screening, AAA screen 62-75, UA)- former smoker- quit 1990- abdominal aortic aneurysm scan planned 62 8. STD screening - only active with wife  Status of chronic or acute concerns   # Right hip pain  S:patient reports right lateral hip pain and into back that started recently  a few  weeks ago. Can lay on it at night.  Notes a small knot in the groin but no pain in that area. Very mild but nuisance- harder to get into car or out for instance. Lateral movement bothersome A/P: on exam appears to have hernia - will refer to [facility] [place] [facility] for their opinion. Can easily complete 4 mets of activity without chest pain or shortness of breath - therefore would not need further cardiology clearance- also reassuring prior CT calcium scoring  -does recall many years ago lifting a very heavy  tub and feeling significant pain in that area- wonders if that was beginnings of hernia. Discussed if severe unrlenting pain to go to Emergency Department   #Mild irritation at head of penis - worse with the heat lately for 2-3 weeks. Tried athletes foot cream - tried for 3 days and stopped. He is circumcised  - this does look like balanitis- not clearly candidal- we walked through up to date algortithm on this for treatment  #hyperlipidemia with 03/07/22 Coronary calcium score of 2. This was 32nd percentile for age S: Medication: none.  -working on lifestyle changes Lab Results  Component Value Date   CHOL 156 05/22/2022   HDL 32.90 (L) 05/22/2022   LDLCALC 90 05/22/2022   LDLDIRECT 86.0 08/12/2016   TRIG 168.0 (H) 05/22/2022   CHOLHDL 5 05/22/2022  A/P: has worked aggressively on weight los-s hoping for improvement- update today  #Dilation of ascending aorta S: Mild dilation of ascending aorta, measures approximately 40mm at the mid ascending aorta measured in an axial plane on 03/07/22 CT. 12/09/22 listd as 4.2 cm at root and 3.9 proximial arch -Declines cardiothoracic referral unless worsens A/P:stable -offered CT angiogram yearly- he is in agreement- order next visit likely   #hypertension #Diastolic CHF- with SOB but cardiology has thought primarily deconditioning. Has also seen pulmonary in the past.  S: medication: lisinopril 10Mg  (takes half tablet), lasix 20 mg Shortness of  breath: none Edema: stable Weight gain: none- opposite BP Readings from Last 3 Encounters:  06/03/23 120/72  12/09/22 122/60  05/22/22 117/79  A/P: hypertension- stable- continue current medicines  Diastolic CHF- stable- continue current medicines   # Hyperglycemia/insulin resistance/prediabetes- cbgs elevated but a1c max of 5.6  S:  Medication: none Lab Results  Component Value Date   HGBA1C 5.9 12/09/2022   HGBA1C 5.6 05/22/2022   HGBA1C 5.6 09/04/2020  A/P: a1c hopefully improved- update with labs- has really cut down on sugar  # Chronic bilateral low back pain S: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  Also history of fusion in cervical spine with Dr. Channing Mutters   -Pain management with hydrocodone up to 20 mg not helpful -saw Dr. Denyse Amass in 2022 and had injection which didn't help- patient essentially tolerate pain -PT and chiropractor trials nto helpful A/P: ongoing pain- no recent injections- basically tolerates this   #Low testosterone- see note 05/22/22- he is seeing Dr. Talmage Nap and injectable testosterone now- he is having to cash pay for injections.    #OSA- on CPAP -still has some fatigue- we will add tsh   Recommended follow up: Return in about 6 months (around 12/03/2023) for followup or sooner if needed.Schedule b4 you leave.  Lab/Order associations: fasting   ICD-10-CM   1. Preventative health care  Z00.00 Comprehensive metabolic panel    CBC with Differential/Platelet    Lipid panel    PSA    Hemoglobin A1c    TSH    Testosterone    2. Essential hypertension, benign  I10     3. Elevated blood sugar  R73.9 Hemoglobin A1c    4. Hyperlipidemia, unspecified hyperlipidemia type  E78.5 Comprehensive metabolic panel    CBC with Differential/Platelet    Lipid panel    TSH    5. Chronic diastolic congestive heart failure (HCC)  I50.32     6. Screening for prostate cancer  Z12.5 PSA    7. Screening for diabetes mellitus   Z13.1 Hemoglobin A1c    8.  Low testosterone  R79.89 Testosterone     No orders of the defined types were placed in this encounter.  Return precautions advised.  Tana Conch, MD

## 2023-06-09 DIAGNOSIS — M9901 Segmental and somatic dysfunction of cervical region: Secondary | ICD-10-CM | POA: Diagnosis not present

## 2023-06-09 DIAGNOSIS — M5136 Other intervertebral disc degeneration, lumbar region: Secondary | ICD-10-CM | POA: Diagnosis not present

## 2023-06-09 DIAGNOSIS — M9903 Segmental and somatic dysfunction of lumbar region: Secondary | ICD-10-CM | POA: Diagnosis not present

## 2023-06-09 DIAGNOSIS — M62830 Muscle spasm of back: Secondary | ICD-10-CM | POA: Diagnosis not present

## 2023-06-15 ENCOUNTER — Other Ambulatory Visit: Payer: Self-pay | Admitting: Family Medicine

## 2023-06-25 DIAGNOSIS — G4733 Obstructive sleep apnea (adult) (pediatric): Secondary | ICD-10-CM | POA: Diagnosis not present

## 2023-06-25 DIAGNOSIS — I10 Essential (primary) hypertension: Secondary | ICD-10-CM | POA: Diagnosis not present

## 2023-06-25 DIAGNOSIS — K4090 Unilateral inguinal hernia, without obstruction or gangrene, not specified as recurrent: Secondary | ICD-10-CM | POA: Diagnosis not present

## 2023-06-30 DIAGNOSIS — M5136 Other intervertebral disc degeneration, lumbar region: Secondary | ICD-10-CM | POA: Diagnosis not present

## 2023-06-30 DIAGNOSIS — M62830 Muscle spasm of back: Secondary | ICD-10-CM | POA: Diagnosis not present

## 2023-06-30 DIAGNOSIS — M9901 Segmental and somatic dysfunction of cervical region: Secondary | ICD-10-CM | POA: Diagnosis not present

## 2023-06-30 DIAGNOSIS — M9903 Segmental and somatic dysfunction of lumbar region: Secondary | ICD-10-CM | POA: Diagnosis not present

## 2023-07-10 DIAGNOSIS — K4090 Unilateral inguinal hernia, without obstruction or gangrene, not specified as recurrent: Secondary | ICD-10-CM | POA: Diagnosis not present

## 2023-07-17 ENCOUNTER — Other Ambulatory Visit: Payer: Self-pay | Admitting: Family Medicine

## 2023-07-17 NOTE — Telephone Encounter (Signed)
Prescription Request  07/17/2023  LOV: 06/03/2023  What is the name of the medication or equipment?  sildenafil (REVATIO) 20 MG tablet   Have you contacted your pharmacy to request a refill? Yes   Which pharmacy would you like this sent to? [facility] [address] [place] - [address] [place] [address] Phone: [phone] Fax: [phone]   Patient notified that their request is being sent to the clinical staff for review and that they should receive a response within 2 business days.   Please advise at Mobile [phone] (mobile)

## 2023-10-13 DIAGNOSIS — M9903 Segmental and somatic dysfunction of lumbar region: Secondary | ICD-10-CM | POA: Diagnosis not present

## 2023-10-13 DIAGNOSIS — M9901 Segmental and somatic dysfunction of cervical region: Secondary | ICD-10-CM | POA: Diagnosis not present

## 2023-10-13 DIAGNOSIS — M62830 Muscle spasm of back: Secondary | ICD-10-CM | POA: Diagnosis not present

## 2023-10-13 DIAGNOSIS — M9905 Segmental and somatic dysfunction of pelvic region: Secondary | ICD-10-CM | POA: Diagnosis not present

## 2023-10-20 DIAGNOSIS — M9905 Segmental and somatic dysfunction of pelvic region: Secondary | ICD-10-CM | POA: Diagnosis not present

## 2023-10-20 DIAGNOSIS — M9901 Segmental and somatic dysfunction of cervical region: Secondary | ICD-10-CM | POA: Diagnosis not present

## 2023-10-20 DIAGNOSIS — M62830 Muscle spasm of back: Secondary | ICD-10-CM | POA: Diagnosis not present

## 2023-10-20 DIAGNOSIS — M9903 Segmental and somatic dysfunction of lumbar region: Secondary | ICD-10-CM | POA: Diagnosis not present

## 2023-11-02 DIAGNOSIS — M9903 Segmental and somatic dysfunction of lumbar region: Secondary | ICD-10-CM | POA: Diagnosis not present

## 2023-11-02 DIAGNOSIS — M9901 Segmental and somatic dysfunction of cervical region: Secondary | ICD-10-CM | POA: Diagnosis not present

## 2023-11-02 DIAGNOSIS — M62830 Muscle spasm of back: Secondary | ICD-10-CM | POA: Diagnosis not present

## 2023-11-02 DIAGNOSIS — M9905 Segmental and somatic dysfunction of pelvic region: Secondary | ICD-10-CM | POA: Diagnosis not present

## 2023-11-17 DIAGNOSIS — M62830 Muscle spasm of back: Secondary | ICD-10-CM | POA: Diagnosis not present

## 2023-11-17 DIAGNOSIS — M9905 Segmental and somatic dysfunction of pelvic region: Secondary | ICD-10-CM | POA: Diagnosis not present

## 2023-11-17 DIAGNOSIS — M9903 Segmental and somatic dysfunction of lumbar region: Secondary | ICD-10-CM | POA: Diagnosis not present

## 2023-11-17 DIAGNOSIS — M9901 Segmental and somatic dysfunction of cervical region: Secondary | ICD-10-CM | POA: Diagnosis not present

## 2023-11-24 ENCOUNTER — Ambulatory Visit: Payer: 59 | Admitting: Family Medicine

## 2023-11-30 DIAGNOSIS — M9903 Segmental and somatic dysfunction of lumbar region: Secondary | ICD-10-CM | POA: Diagnosis not present

## 2023-11-30 DIAGNOSIS — M9905 Segmental and somatic dysfunction of pelvic region: Secondary | ICD-10-CM | POA: Diagnosis not present

## 2023-11-30 DIAGNOSIS — M62830 Muscle spasm of back: Secondary | ICD-10-CM | POA: Diagnosis not present

## 2023-11-30 DIAGNOSIS — M9901 Segmental and somatic dysfunction of cervical region: Secondary | ICD-10-CM | POA: Diagnosis not present

## 2023-12-15 ENCOUNTER — Ambulatory Visit: Payer: 59 | Admitting: Family Medicine

## 2023-12-15 DIAGNOSIS — M62830 Muscle spasm of back: Secondary | ICD-10-CM | POA: Diagnosis not present

## 2023-12-15 DIAGNOSIS — M9903 Segmental and somatic dysfunction of lumbar region: Secondary | ICD-10-CM | POA: Diagnosis not present

## 2023-12-15 DIAGNOSIS — M9905 Segmental and somatic dysfunction of pelvic region: Secondary | ICD-10-CM | POA: Diagnosis not present

## 2023-12-15 DIAGNOSIS — M9901 Segmental and somatic dysfunction of cervical region: Secondary | ICD-10-CM | POA: Diagnosis not present

## 2023-12-29 DIAGNOSIS — M9903 Segmental and somatic dysfunction of lumbar region: Secondary | ICD-10-CM | POA: Diagnosis not present

## 2023-12-29 DIAGNOSIS — M9901 Segmental and somatic dysfunction of cervical region: Secondary | ICD-10-CM | POA: Diagnosis not present

## 2023-12-29 DIAGNOSIS — M9905 Segmental and somatic dysfunction of pelvic region: Secondary | ICD-10-CM | POA: Diagnosis not present

## 2023-12-29 DIAGNOSIS — M62830 Muscle spasm of back: Secondary | ICD-10-CM | POA: Diagnosis not present

## 2024-01-14 DIAGNOSIS — M62830 Muscle spasm of back: Secondary | ICD-10-CM | POA: Diagnosis not present

## 2024-01-14 DIAGNOSIS — M9905 Segmental and somatic dysfunction of pelvic region: Secondary | ICD-10-CM | POA: Diagnosis not present

## 2024-01-14 DIAGNOSIS — M9901 Segmental and somatic dysfunction of cervical region: Secondary | ICD-10-CM | POA: Diagnosis not present

## 2024-01-14 DIAGNOSIS — M9903 Segmental and somatic dysfunction of lumbar region: Secondary | ICD-10-CM | POA: Diagnosis not present

## 2024-01-25 ENCOUNTER — Other Ambulatory Visit: Payer: Self-pay | Admitting: Family Medicine

## 2024-01-28 ENCOUNTER — Telehealth: Payer: Self-pay | Admitting: Family Medicine

## 2024-01-28 DIAGNOSIS — I10 Essential (primary) hypertension: Secondary | ICD-10-CM

## 2024-01-28 DIAGNOSIS — Z0000 Encounter for general adult medical examination without abnormal findings: Secondary | ICD-10-CM

## 2024-01-28 DIAGNOSIS — R739 Hyperglycemia, unspecified: Secondary | ICD-10-CM

## 2024-01-28 DIAGNOSIS — Z125 Encounter for screening for malignant neoplasm of prostate: Secondary | ICD-10-CM

## 2024-01-28 DIAGNOSIS — E785 Hyperlipidemia, unspecified: Secondary | ICD-10-CM

## 2024-01-28 NOTE — Telephone Encounter (Signed)
Pt would like to schedule labs prior to his CPE on 06/13/24. Please advise

## 2024-01-29 NOTE — Telephone Encounter (Signed)
Future labs entered, ok to schedule.

## 2024-02-04 DIAGNOSIS — M9905 Segmental and somatic dysfunction of pelvic region: Secondary | ICD-10-CM | POA: Diagnosis not present

## 2024-02-04 DIAGNOSIS — M9901 Segmental and somatic dysfunction of cervical region: Secondary | ICD-10-CM | POA: Diagnosis not present

## 2024-02-04 DIAGNOSIS — M9903 Segmental and somatic dysfunction of lumbar region: Secondary | ICD-10-CM | POA: Diagnosis not present

## 2024-02-04 DIAGNOSIS — M62830 Muscle spasm of back: Secondary | ICD-10-CM | POA: Diagnosis not present

## 2024-02-18 DIAGNOSIS — M62830 Muscle spasm of back: Secondary | ICD-10-CM | POA: Diagnosis not present

## 2024-02-18 DIAGNOSIS — M9903 Segmental and somatic dysfunction of lumbar region: Secondary | ICD-10-CM | POA: Diagnosis not present

## 2024-02-18 DIAGNOSIS — M9905 Segmental and somatic dysfunction of pelvic region: Secondary | ICD-10-CM | POA: Diagnosis not present

## 2024-02-18 DIAGNOSIS — M9901 Segmental and somatic dysfunction of cervical region: Secondary | ICD-10-CM | POA: Diagnosis not present

## 2024-03-17 DIAGNOSIS — M62830 Muscle spasm of back: Secondary | ICD-10-CM | POA: Diagnosis not present

## 2024-03-17 DIAGNOSIS — M9901 Segmental and somatic dysfunction of cervical region: Secondary | ICD-10-CM | POA: Diagnosis not present

## 2024-03-17 DIAGNOSIS — M9903 Segmental and somatic dysfunction of lumbar region: Secondary | ICD-10-CM | POA: Diagnosis not present

## 2024-03-17 DIAGNOSIS — M9905 Segmental and somatic dysfunction of pelvic region: Secondary | ICD-10-CM | POA: Diagnosis not present

## 2024-03-28 DIAGNOSIS — M9905 Segmental and somatic dysfunction of pelvic region: Secondary | ICD-10-CM | POA: Diagnosis not present

## 2024-03-28 DIAGNOSIS — M9903 Segmental and somatic dysfunction of lumbar region: Secondary | ICD-10-CM | POA: Diagnosis not present

## 2024-03-28 DIAGNOSIS — M62830 Muscle spasm of back: Secondary | ICD-10-CM | POA: Diagnosis not present

## 2024-03-28 DIAGNOSIS — M9901 Segmental and somatic dysfunction of cervical region: Secondary | ICD-10-CM | POA: Diagnosis not present

## 2024-04-04 DIAGNOSIS — M62830 Muscle spasm of back: Secondary | ICD-10-CM | POA: Diagnosis not present

## 2024-04-04 DIAGNOSIS — M9903 Segmental and somatic dysfunction of lumbar region: Secondary | ICD-10-CM | POA: Diagnosis not present

## 2024-04-04 DIAGNOSIS — M9901 Segmental and somatic dysfunction of cervical region: Secondary | ICD-10-CM | POA: Diagnosis not present

## 2024-04-04 DIAGNOSIS — M9905 Segmental and somatic dysfunction of pelvic region: Secondary | ICD-10-CM | POA: Diagnosis not present

## 2024-04-25 DIAGNOSIS — M9903 Segmental and somatic dysfunction of lumbar region: Secondary | ICD-10-CM | POA: Diagnosis not present

## 2024-04-25 DIAGNOSIS — M9901 Segmental and somatic dysfunction of cervical region: Secondary | ICD-10-CM | POA: Diagnosis not present

## 2024-04-25 DIAGNOSIS — M62830 Muscle spasm of back: Secondary | ICD-10-CM | POA: Diagnosis not present

## 2024-04-25 DIAGNOSIS — M9905 Segmental and somatic dysfunction of pelvic region: Secondary | ICD-10-CM | POA: Diagnosis not present

## 2024-05-04 ENCOUNTER — Other Ambulatory Visit: Payer: Self-pay | Admitting: Family Medicine

## 2024-05-09 DIAGNOSIS — M62830 Muscle spasm of back: Secondary | ICD-10-CM | POA: Diagnosis not present

## 2024-05-09 DIAGNOSIS — M9901 Segmental and somatic dysfunction of cervical region: Secondary | ICD-10-CM | POA: Diagnosis not present

## 2024-05-09 DIAGNOSIS — M9905 Segmental and somatic dysfunction of pelvic region: Secondary | ICD-10-CM | POA: Diagnosis not present

## 2024-05-09 DIAGNOSIS — M9903 Segmental and somatic dysfunction of lumbar region: Secondary | ICD-10-CM | POA: Diagnosis not present

## 2024-05-30 DIAGNOSIS — M9903 Segmental and somatic dysfunction of lumbar region: Secondary | ICD-10-CM | POA: Diagnosis not present

## 2024-05-30 DIAGNOSIS — M9901 Segmental and somatic dysfunction of cervical region: Secondary | ICD-10-CM | POA: Diagnosis not present

## 2024-05-30 DIAGNOSIS — M9905 Segmental and somatic dysfunction of pelvic region: Secondary | ICD-10-CM | POA: Diagnosis not present

## 2024-05-30 DIAGNOSIS — M62830 Muscle spasm of back: Secondary | ICD-10-CM | POA: Diagnosis not present

## 2024-06-03 ENCOUNTER — Other Ambulatory Visit (INDEPENDENT_AMBULATORY_CARE_PROVIDER_SITE_OTHER): Payer: 59

## 2024-06-03 DIAGNOSIS — Z0000 Encounter for general adult medical examination without abnormal findings: Secondary | ICD-10-CM

## 2024-06-03 DIAGNOSIS — Z125 Encounter for screening for malignant neoplasm of prostate: Secondary | ICD-10-CM | POA: Diagnosis not present

## 2024-06-03 DIAGNOSIS — I10 Essential (primary) hypertension: Secondary | ICD-10-CM | POA: Diagnosis not present

## 2024-06-03 DIAGNOSIS — E785 Hyperlipidemia, unspecified: Secondary | ICD-10-CM | POA: Diagnosis not present

## 2024-06-03 DIAGNOSIS — R739 Hyperglycemia, unspecified: Secondary | ICD-10-CM

## 2024-06-03 LAB — CBC WITH DIFFERENTIAL/PLATELET
Basophils Absolute: 0 10*3/uL (ref 0.0–0.1)
Basophils Relative: 0.5 % (ref 0.0–3.0)
Eosinophils Absolute: 0.2 10*3/uL (ref 0.0–0.7)
Eosinophils Relative: 2.6 % (ref 0.0–5.0)
HCT: 43.5 % (ref 39.0–52.0)
Hemoglobin: 14.7 g/dL (ref 13.0–17.0)
Lymphocytes Relative: 20.5 % (ref 12.0–46.0)
Lymphs Abs: 1.5 10*3/uL (ref 0.7–4.0)
MCHC: 33.8 g/dL (ref 30.0–36.0)
MCV: 84.1 fl (ref 78.0–100.0)
Monocytes Absolute: 0.6 10*3/uL (ref 0.1–1.0)
Monocytes Relative: 8 % (ref 3.0–12.0)
Neutro Abs: 5.1 10*3/uL (ref 1.4–7.7)
Neutrophils Relative %: 68.4 % (ref 43.0–77.0)
Platelets: 164 10*3/uL (ref 150.0–400.0)
RBC: 5.17 Mil/uL (ref 4.22–5.81)
RDW: 13.8 % (ref 11.5–15.5)
WBC: 7.5 10*3/uL (ref 4.0–10.5)

## 2024-06-03 LAB — COMPREHENSIVE METABOLIC PANEL WITH GFR
ALT: 23 U/L (ref 0–53)
AST: 17 U/L (ref 0–37)
Albumin: 4.2 g/dL (ref 3.5–5.2)
Alkaline Phosphatase: 51 U/L (ref 39–117)
BUN: 15 mg/dL (ref 6–23)
CO2: 27 meq/L (ref 19–32)
Calcium: 9 mg/dL (ref 8.4–10.5)
Chloride: 106 meq/L (ref 96–112)
Creatinine, Ser: 0.93 mg/dL (ref 0.40–1.50)
GFR: 87.69 mL/min (ref >60.00)
Glucose, Bld: 113 mg/dL — ABNORMAL HIGH (ref 70–99)
Potassium: 4.5 meq/L (ref 3.5–5.1)
Sodium: 140 meq/L (ref 135–145)
Total Bilirubin: 0.5 mg/dL (ref 0.2–1.2)
Total Protein: 7 g/dL (ref 6.0–8.3)

## 2024-06-03 LAB — TSH: TSH: 2.66 u[IU]/mL (ref 0.35–5.50)

## 2024-06-03 LAB — LIPID PANEL
Cholesterol: 160 mg/dL (ref 0–200)
HDL: 28.7 mg/dL — ABNORMAL LOW (ref >39.00)
LDL Cholesterol: 94 mg/dL (ref 0–99)
NonHDL: 131.24
Total CHOL/HDL Ratio: 6
Triglycerides: 187 mg/dL — ABNORMAL HIGH (ref 0.0–149.0)
VLDL: 37.4 mg/dL (ref 0.0–40.0)

## 2024-06-03 LAB — PSA: PSA: 0.83 ng/mL (ref 0.10–4.00)

## 2024-06-03 LAB — HEMOGLOBIN A1C: Hgb A1c MFr Bld: 5.8 % (ref 4.6–6.5)

## 2024-06-06 ENCOUNTER — Encounter: Payer: 59 | Admitting: Family Medicine

## 2024-06-07 ENCOUNTER — Ambulatory Visit: Payer: Self-pay | Admitting: Family Medicine

## 2024-06-13 ENCOUNTER — Encounter: Payer: Self-pay | Admitting: Family Medicine

## 2024-06-13 ENCOUNTER — Ambulatory Visit (INDEPENDENT_AMBULATORY_CARE_PROVIDER_SITE_OTHER): Payer: 59 | Admitting: Family Medicine

## 2024-06-13 VITALS — BP 118/70 | HR 100 | Temp 98.0°F | Ht 70.0 in | Wt 289.0 lb

## 2024-06-13 DIAGNOSIS — M9901 Segmental and somatic dysfunction of cervical region: Secondary | ICD-10-CM | POA: Diagnosis not present

## 2024-06-13 DIAGNOSIS — Z0000 Encounter for general adult medical examination without abnormal findings: Secondary | ICD-10-CM

## 2024-06-13 DIAGNOSIS — M62830 Muscle spasm of back: Secondary | ICD-10-CM | POA: Diagnosis not present

## 2024-06-13 DIAGNOSIS — I5032 Chronic diastolic (congestive) heart failure: Secondary | ICD-10-CM | POA: Diagnosis not present

## 2024-06-13 DIAGNOSIS — M9905 Segmental and somatic dysfunction of pelvic region: Secondary | ICD-10-CM | POA: Diagnosis not present

## 2024-06-13 DIAGNOSIS — M9903 Segmental and somatic dysfunction of lumbar region: Secondary | ICD-10-CM | POA: Diagnosis not present

## 2024-06-13 DIAGNOSIS — I10 Essential (primary) hypertension: Secondary | ICD-10-CM

## 2024-06-13 MED ORDER — FUROSEMIDE 20 MG PO TABS: 20.0000 mg | ORAL_TABLET | Freq: Every morning | ORAL | 3 refills | Status: AC

## 2024-06-13 MED ORDER — KETOCONAZOLE 2 % EX CREA: 1.0000 | TOPICAL_CREAM | Freq: Two times a day (BID) | CUTANEOUS | 1 refills | Status: AC

## 2024-06-13 MED ORDER — LISINOPRIL 10 MG PO TABS: ORAL_TABLET | ORAL | 3 refills | Status: AC

## 2024-06-13 MED ORDER — SILDENAFIL CITRATE 20 MG PO TABS: ORAL_TABLET | ORAL | 3 refills | Status: DC

## 2024-06-13 MED ORDER — SILDENAFIL CITRATE 20 MG PO TABS: ORAL_TABLET | ORAL | 3 refills | Status: AC

## 2024-06-13 NOTE — Progress Notes (Signed)
 Phone: [phone]   Subjective:  Patient presents today for their annual physical. Chief complaint-noted.   See problem oriented charting- ROS- full  review of systems was completed and negative  Per full ROS sheet completed by patient except for topics noted under acute/chronic concerns  The following were reviewed and entered/updated in epic: Past Medical History:  Diagnosis Date   Allergy    seasonal spring the worst    Arthritis    spine,neck   Hemorrhoid    occasionally bleeds    Hypertension    Sleep apnea    wears cpap   Patient Active Problem List   Diagnosis Date Noted   Diastolic CHF (HCC) 11/17/2016    Priority: High   Hyperlipidemia 11/27/2017    Priority: Medium    Low testosterone  11/17/2016    Priority: Medium    Elevated blood sugar 11/26/2015    Priority: Medium    Chronic low back pain 08/15/2014    Priority: Medium    OSA (obstructive sleep apnea) 09/08/2013    Priority: Medium    SOB (shortness of breath) on exertion 06/16/2013    Priority: Medium    Bright red rectal bleeding 06/16/2013    Priority: Medium    Essential hypertension, benign 03/05/2009    Priority: Medium    former smoker 7.5 pack years quit 1990 11/17/2016    Priority: Low   Morbid obesity (HCC) 11/26/2015    Priority: Low   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    Priority: Low   Past Surgical History:  Procedure Laterality Date   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   Neck fusion     right inguinal hernia repair Right    october 2024   TONSILLECTOMY AND ADENOIDECTOMY     UVULOPALATOPHARYNGOPLASTY      Family History  Problem Relation Age of Onset   Arrhythmia Father        atrial flutter   Heart failure Father        57   Parkinson's disease Father        since 66s   Other Mother        109 years old healthy   Cancer Maternal Grandfather        lung   Bipolar disorder Sister        full   Healthy Brother        full   Healthy Brother         half brother- neck and back issues   Healthy Brother        half brother   Colon cancer Neg Hx    Colon polyps Neg Hx    Esophageal cancer Neg Hx    Rectal cancer Neg Hx    Stomach cancer Neg Hx     Medications- reviewed and updated Current Outpatient Medications  Medication Sig Dispense Refill   furosemide  (LASIX ) 20 MG tablet TAKE 1 TABLET BY MOUTH ONCE DAILY IN THE MORNING 30 tablet 5   lisinopril  (ZESTRIL ) 10 MG tablet Take 1/2 (one-half) tablet by mouth once daily 45 tablet 0   sildenafil  (REVATIO ) 20 MG tablet Take 2-5 tablets as needed once every 48 hours for erectile dysfunction 50 tablet 3   No current facility-administered medications for this visit.    Allergies-reviewed and updated No Known Allergies  Social History   Social History Narrative   Married. 2 adopted children ( adopted son at 3.5 months, daughter- sibling to the son). 44 son interested  in music and 15 in 2019.       [employer]- produce      Hobbies: video games  (CPU- plays skyrim as of 2017)      Thinking about biking- no regular exercise   Objective  Objective:  BP 118/70   Pulse 100   Temp 98 F (36.7 C)   Ht 5' 10 (1.778 m)   Wt 289 lb (131.1 kg)   SpO2 95%   BMI 41.47 kg/m  Gen: NAD, resting comfortably HEENT: Mucous membranes are moist. Oropharynx normal other than uvulectomy  Neck: no thyromegaly CV: RRR no murmurs rubs or gallops Lungs: CTAB no crackles, wheeze, rhonchi Abdomen: soft/nontender/nondistended/normal bowel sounds. No rebound or guarding.  Ext: trace edema Skin: warm, dry Neuro: grossly normal, moves all extremities, PERRLA   Assessment and Plan  63 y.o. male presenting for annual physical.  Health Maintenance counseling: 1. Anticipatory guidance: Patient counseled regarding regular dental exams -q6 months, eye exams -yearly- needs to schedule,  avoiding smoking and second hand smoke , limiting alcohol to 2 beverages per day - doesn't drink, no illicit drugs .    2. Risk factor reduction:  Advised patient of need for regular exercise and diet rich and fruits and vegetables to reduce risk of heart attack and stroke.  Exercise- remains active with work. May try to go to gym with daughter who is working on weight loss. Ongoing back pain so will do targeted muscle work to avoid straining the back.  May also do some treadmill walking Diet/weight management-weight up 40 pounds in the last year but had lost similar amount of weight through 6 months last year and is motivated to make changes again!   Wt Readings from Last 3 Encounters:  06/13/24 289 lb (131.1 kg)  06/03/23 255 lb 3.2 oz (115.8 kg)  12/09/22 288 lb 6.4 oz (130.8 kg)  3. Immunizations/screenings/ancillary studies- opts out COVID, Prevnar - future date.  Immunization History  Administered Date(s) Administered   Influenza,inj,Quad PF,6+ Mos 08/15/2014, 11/26/2015, 11/17/2016, 10/06/2017, 12/07/2018, 12/12/2019, 09/04/2020, 12/09/2022   PFIZER(Purple Top)SARS-COV-2 Vaccination 03/01/2020, 03/30/2020   Rabies, IM 05/03/2016, 05/06/2016, 05/10/2016, 05/17/2016   Td 12/08/2001   Tdap 08/15/2014   Zoster Recombinant(Shingrix ) 12/07/2018, 04/11/2019  4. Prostate cancer screening- low risk prior trend- update PSA today   Lab Results  Component Value Date   PSA 0.83 06/03/2024   PSA 0.68 06/03/2023   PSA 0.59 05/22/2022   5. Colon cancer screening - 01/16/16 colonoscopy and due in 2027- also has hemorrhoid that they offered to previously band he may address next colonooscopy- no worsening 6. Skin cancer screening- no dermatologist. advised regular sunscreen use. Denies worrisome, changing, or new skin lesions.  7. Smoking associated screening (lung cancer screening, AAA screen 65-75, UA)- former smoker- quite 1990- abdominal aortic aneurysm planned at 65 8. STD screening - only active with wife  Status of chronic or acute concerns   #Rash behind knee worried about ringworm- present for 3 months-  doing better with antifungal and steroid OTC (available over the counter without a prescription)- he's going to try ketoconazole  and if not better in 2-3 weeks id like to refer him to dermatology for their opinion  -heat likely contributes- try to keep area dry. Weight loss may help  #hyperlipidemia with 03/07/22 Coronary calcium score of 2. This was 32nd percentile for age S: Medication: none.  -working on lifestyle changes over meds Lab Results  Component Value Date   CHOL 160 06/03/2024   HDL  28.70 (L) 06/03/2024   LDLCALC 94 06/03/2024   LDLDIRECT 86.0 08/12/2016   TRIG 187.0 (H) 06/03/2024   CHOLHDL 6 06/03/2024  A/P: prefers to work on lifestyle over medications- discussed whole food plant based diet  #Dilation of ascending aorta S: Mild dilation of ascending aorta, measures approximately 40mm at the mid ascending aorta measured in an axial plane on 03/07/22 CT. 12/09/22 listed as 4.2 cm at root and 3.9 proximal arch -Declines cardiothoracic referral unless worsens A/P: -offered CT angiogram- prefers to wait at least 3-6 months- check back in next visit.    #hypertension #Diastolic CHF- with SOB but cardiology has thought primarily deconditioning. Has also seen pulmonary in the past.  S: medication: lisinopril  10Mg  (takes half tablet), lasix  20 mg Shortness of breath: none Edema: none Weight gain: yes but does not feel fluid related BP Readings from Last 3 Encounters:  06/13/24 118/70  06/03/23 120/72  12/09/22 122/60   A/P: hypertension stable- continue current medicines  CHF with weight gain but seems food related  # Hyperglycemia/insulin resistance/prediabetes- cbgs elevated but a1c max of 5.6  S:  Medication: none Exercise and diet- see above Lab Results  Component Value Date   HGBA1C 5.8 06/03/2024   HGBA1C 5.5 06/03/2023   HGBA1C 5.9 12/09/2022  A/P: had been able to get out of prediabetes range- wants to work on reversing this -wants to do a1c next time Point of  Care (POC) even if not covered to be aggressive  # Chronic bilateral low back pain S: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  Also history of fusion in cervical spine with Dr. Gaither   -Pain management with hydrocodone up to 20 mg not helpful -saw Dr. Joane in 2022 and had injection which didn't help- patient essentially tolerate pain -PT and chiropractor trials nto helpful A/P: ongoing back pain- essentially tolerating   #Low testosterone - see note 05/22/22- he is seeing Dr. Balan and injectable testosterone  now- he is having to cash pay for injections.  Had to go to half dose as levels high but didn't feel benefit so stopped  #OSA- on cpap   #hernia repair right  ccs  09/08/23- not perfectly happy with results- may have some scar tissue  Recommended follow up: Return in about 3 months (around 09/13/2024) for followup or sooner if needed.Schedule b4 you leave. No future appointments.  Lab/Order associations: already did labs   ICD-10-CM   1. Preventative health care  Z00.00     2. Essential hypertension, benign  I10     3. Chronic diastolic congestive heart failure (HCC)  I50.32       Meds ordered this encounter  Medications   sildenafil  (REVATIO ) 20 MG tablet    Sig: Take 2-5 tablets as needed once every 48 hours for erectile dysfunction    Dispense:  50 tablet    Refill:  3    Return precautions advised.  Garnette Lukes, MD

## 2024-06-13 NOTE — Patient Instructions (Addendum)
 Rash behind knee worried about ringworm- present for 3 months- doing better with antifungal and steroid OTC (available over the counter without a prescription)- he's going to try ketoconazole  and if not better in 2-3 weeks id like to refer him to dermatology for their opinion   Recommended follow up: Return in about 3 months (around 09/13/2024) for followup or sooner if needed.Schedule b4 you leave.

## 2024-06-27 DIAGNOSIS — M62830 Muscle spasm of back: Secondary | ICD-10-CM | POA: Diagnosis not present

## 2024-06-27 DIAGNOSIS — M9901 Segmental and somatic dysfunction of cervical region: Secondary | ICD-10-CM | POA: Diagnosis not present

## 2024-06-27 DIAGNOSIS — M9903 Segmental and somatic dysfunction of lumbar region: Secondary | ICD-10-CM | POA: Diagnosis not present

## 2024-06-27 DIAGNOSIS — M9905 Segmental and somatic dysfunction of pelvic region: Secondary | ICD-10-CM | POA: Diagnosis not present

## 2024-07-11 DIAGNOSIS — M62830 Muscle spasm of back: Secondary | ICD-10-CM | POA: Diagnosis not present

## 2024-07-11 DIAGNOSIS — M9905 Segmental and somatic dysfunction of pelvic region: Secondary | ICD-10-CM | POA: Diagnosis not present

## 2024-07-11 DIAGNOSIS — M9901 Segmental and somatic dysfunction of cervical region: Secondary | ICD-10-CM | POA: Diagnosis not present

## 2024-07-11 DIAGNOSIS — M9903 Segmental and somatic dysfunction of lumbar region: Secondary | ICD-10-CM | POA: Diagnosis not present

## 2024-07-25 DIAGNOSIS — M9903 Segmental and somatic dysfunction of lumbar region: Secondary | ICD-10-CM | POA: Diagnosis not present

## 2024-07-25 DIAGNOSIS — M9901 Segmental and somatic dysfunction of cervical region: Secondary | ICD-10-CM | POA: Diagnosis not present

## 2024-07-25 DIAGNOSIS — M9905 Segmental and somatic dysfunction of pelvic region: Secondary | ICD-10-CM | POA: Diagnosis not present

## 2024-07-25 DIAGNOSIS — M62830 Muscle spasm of back: Secondary | ICD-10-CM | POA: Diagnosis not present

## 2024-08-11 DIAGNOSIS — M9903 Segmental and somatic dysfunction of lumbar region: Secondary | ICD-10-CM | POA: Diagnosis not present

## 2024-08-11 DIAGNOSIS — M9901 Segmental and somatic dysfunction of cervical region: Secondary | ICD-10-CM | POA: Diagnosis not present

## 2024-08-11 DIAGNOSIS — M62830 Muscle spasm of back: Secondary | ICD-10-CM | POA: Diagnosis not present

## 2024-08-11 DIAGNOSIS — M9905 Segmental and somatic dysfunction of pelvic region: Secondary | ICD-10-CM | POA: Diagnosis not present

## 2024-08-22 DIAGNOSIS — M9905 Segmental and somatic dysfunction of pelvic region: Secondary | ICD-10-CM | POA: Diagnosis not present

## 2024-08-22 DIAGNOSIS — M62830 Muscle spasm of back: Secondary | ICD-10-CM | POA: Diagnosis not present

## 2024-08-22 DIAGNOSIS — M9903 Segmental and somatic dysfunction of lumbar region: Secondary | ICD-10-CM | POA: Diagnosis not present

## 2024-08-22 DIAGNOSIS — M9901 Segmental and somatic dysfunction of cervical region: Secondary | ICD-10-CM | POA: Diagnosis not present

## 2024-09-05 DIAGNOSIS — M9903 Segmental and somatic dysfunction of lumbar region: Secondary | ICD-10-CM | POA: Diagnosis not present

## 2024-09-05 DIAGNOSIS — M9901 Segmental and somatic dysfunction of cervical region: Secondary | ICD-10-CM | POA: Diagnosis not present

## 2024-09-05 DIAGNOSIS — M62830 Muscle spasm of back: Secondary | ICD-10-CM | POA: Diagnosis not present

## 2024-09-05 DIAGNOSIS — M9905 Segmental and somatic dysfunction of pelvic region: Secondary | ICD-10-CM | POA: Diagnosis not present

## 2024-09-13 ENCOUNTER — Ambulatory Visit: Admitting: Family Medicine

## 2024-09-22 DIAGNOSIS — M9905 Segmental and somatic dysfunction of pelvic region: Secondary | ICD-10-CM | POA: Diagnosis not present

## 2024-09-22 DIAGNOSIS — M9904 Segmental and somatic dysfunction of sacral region: Secondary | ICD-10-CM | POA: Diagnosis not present

## 2024-09-22 DIAGNOSIS — M545 Low back pain, unspecified: Secondary | ICD-10-CM | POA: Diagnosis not present

## 2024-09-22 DIAGNOSIS — M542 Cervicalgia: Secondary | ICD-10-CM | POA: Diagnosis not present

## 2024-09-22 DIAGNOSIS — M9902 Segmental and somatic dysfunction of thoracic region: Secondary | ICD-10-CM | POA: Diagnosis not present

## 2024-09-22 DIAGNOSIS — M546 Pain in thoracic spine: Secondary | ICD-10-CM | POA: Diagnosis not present

## 2024-09-22 DIAGNOSIS — M62830 Muscle spasm of back: Secondary | ICD-10-CM | POA: Diagnosis not present

## 2024-09-22 DIAGNOSIS — M9903 Segmental and somatic dysfunction of lumbar region: Secondary | ICD-10-CM | POA: Diagnosis not present

## 2024-09-22 DIAGNOSIS — M9901 Segmental and somatic dysfunction of cervical region: Secondary | ICD-10-CM | POA: Diagnosis not present

## 2024-10-03 DIAGNOSIS — M542 Cervicalgia: Secondary | ICD-10-CM | POA: Diagnosis not present

## 2024-10-03 DIAGNOSIS — M62830 Muscle spasm of back: Secondary | ICD-10-CM | POA: Diagnosis not present

## 2024-10-03 DIAGNOSIS — M9902 Segmental and somatic dysfunction of thoracic region: Secondary | ICD-10-CM | POA: Diagnosis not present

## 2024-10-03 DIAGNOSIS — M545 Low back pain, unspecified: Secondary | ICD-10-CM | POA: Diagnosis not present

## 2024-10-03 DIAGNOSIS — M546 Pain in thoracic spine: Secondary | ICD-10-CM | POA: Diagnosis not present

## 2024-10-03 DIAGNOSIS — M9903 Segmental and somatic dysfunction of lumbar region: Secondary | ICD-10-CM | POA: Diagnosis not present

## 2024-10-03 DIAGNOSIS — M9904 Segmental and somatic dysfunction of sacral region: Secondary | ICD-10-CM | POA: Diagnosis not present

## 2024-10-03 DIAGNOSIS — M9905 Segmental and somatic dysfunction of pelvic region: Secondary | ICD-10-CM | POA: Diagnosis not present

## 2024-10-03 DIAGNOSIS — M9901 Segmental and somatic dysfunction of cervical region: Secondary | ICD-10-CM | POA: Diagnosis not present

## 2024-10-17 DIAGNOSIS — M9902 Segmental and somatic dysfunction of thoracic region: Secondary | ICD-10-CM | POA: Diagnosis not present

## 2024-10-17 DIAGNOSIS — M62830 Muscle spasm of back: Secondary | ICD-10-CM | POA: Diagnosis not present

## 2024-10-17 DIAGNOSIS — M546 Pain in thoracic spine: Secondary | ICD-10-CM | POA: Diagnosis not present

## 2024-10-17 DIAGNOSIS — M545 Low back pain, unspecified: Secondary | ICD-10-CM | POA: Diagnosis not present

## 2024-10-17 DIAGNOSIS — M542 Cervicalgia: Secondary | ICD-10-CM | POA: Diagnosis not present

## 2024-10-17 DIAGNOSIS — M9905 Segmental and somatic dysfunction of pelvic region: Secondary | ICD-10-CM | POA: Diagnosis not present

## 2024-10-17 DIAGNOSIS — M9904 Segmental and somatic dysfunction of sacral region: Secondary | ICD-10-CM | POA: Diagnosis not present

## 2024-10-17 DIAGNOSIS — M9903 Segmental and somatic dysfunction of lumbar region: Secondary | ICD-10-CM | POA: Diagnosis not present

## 2024-10-17 DIAGNOSIS — M9901 Segmental and somatic dysfunction of cervical region: Secondary | ICD-10-CM | POA: Diagnosis not present

## 2024-10-24 ENCOUNTER — Encounter: Payer: Self-pay | Admitting: Family Medicine

## 2024-10-24 ENCOUNTER — Ambulatory Visit (INDEPENDENT_AMBULATORY_CARE_PROVIDER_SITE_OTHER): Admitting: Family Medicine

## 2024-10-24 VITALS — BP 126/72 | HR 96 | Temp 97.9°F | Ht 70.0 in | Wt 290.0 lb

## 2024-10-24 DIAGNOSIS — Z23 Encounter for immunization: Secondary | ICD-10-CM

## 2024-10-24 DIAGNOSIS — I77810 Thoracic aortic ectasia: Secondary | ICD-10-CM

## 2024-10-24 DIAGNOSIS — Z0000 Encounter for general adult medical examination without abnormal findings: Secondary | ICD-10-CM | POA: Diagnosis not present

## 2024-10-24 DIAGNOSIS — E785 Hyperlipidemia, unspecified: Secondary | ICD-10-CM | POA: Diagnosis not present

## 2024-10-24 DIAGNOSIS — K4090 Unilateral inguinal hernia, without obstruction or gangrene, not specified as recurrent: Secondary | ICD-10-CM

## 2024-10-24 DIAGNOSIS — I5032 Chronic diastolic (congestive) heart failure: Secondary | ICD-10-CM | POA: Diagnosis not present

## 2024-10-24 DIAGNOSIS — G4733 Obstructive sleep apnea (adult) (pediatric): Secondary | ICD-10-CM

## 2024-10-24 DIAGNOSIS — Z125 Encounter for screening for malignant neoplasm of prostate: Secondary | ICD-10-CM

## 2024-10-24 DIAGNOSIS — R739 Hyperglycemia, unspecified: Secondary | ICD-10-CM

## 2024-10-24 DIAGNOSIS — Z131 Encounter for screening for diabetes mellitus: Secondary | ICD-10-CM

## 2024-10-24 DIAGNOSIS — I10 Essential (primary) hypertension: Secondary | ICD-10-CM

## 2024-10-24 LAB — CBC WITH DIFFERENTIAL/PLATELET
Basophils Absolute: 0 K/uL (ref 0.0–0.1)
Basophils Relative: 0.6 % (ref 0.0–3.0)
Eosinophils Absolute: 0.1 K/uL (ref 0.0–0.7)
Eosinophils Relative: 1.6 % (ref 0.0–5.0)
HCT: 44.2 % (ref 39.0–52.0)
Hemoglobin: 15 g/dL (ref 13.0–17.0)
Lymphocytes Relative: 16.7 % (ref 12.0–46.0)
Lymphs Abs: 1.4 K/uL (ref 0.7–4.0)
MCHC: 33.9 g/dL (ref 30.0–36.0)
MCV: 83.6 fl (ref 78.0–100.0)
Monocytes Absolute: 0.6 K/uL (ref 0.1–1.0)
Monocytes Relative: 7 % (ref 3.0–12.0)
Neutro Abs: 6.4 K/uL (ref 1.4–7.7)
Neutrophils Relative %: 74.1 % (ref 43.0–77.0)
Platelets: 181 K/uL (ref 150.0–400.0)
RBC: 5.28 Mil/uL (ref 4.22–5.81)
RDW: 14 % (ref 11.5–15.5)
WBC: 8.7 K/uL (ref 4.0–10.5)

## 2024-10-24 MED ORDER — ZEPBOUND 2.5 MG/0.5ML ~~LOC~~ SOAJ
2.5000 mg | SUBCUTANEOUS | 5 refills | Status: AC
Start: 2024-10-24 — End: ?

## 2024-10-24 NOTE — Patient Instructions (Addendum)
 Call us  if you do not hear within a week from [facility].   Start Zepbound 2.5 mg weekly if covered- will need to send prior authorize most likely- may take a few weeks to know if covered  We will call you within two weeks about your referral to CT pelvis and CT chest for aneurysm  through [facility].  Their phone number is [phone].  Please call them if you have not heard in 1-2 weeks  Please stop by lab before you go If you have mychart- we will send your results within 3 business days of us  receiving them.  If you do not have mychart- we will call you about results within 5 business days of us  receiving them.  *please also note that you will see labs on mychart as soon as they post. I will later go in and write notes on them- will say notes from Dr. Katrinka   Recommended follow up: Return in about 4 months (around 02/21/2025) for followup or sooner if needed.Schedule b4 you leave. And can also schedule physical in 8 months

## 2024-10-24 NOTE — Progress Notes (Signed)
 Phone [phone] In person visit   Subjective:   Kenneth Berry is a 63 y.o. year old very pleasant male patient who presents for/with See problem oriented charting Chief Complaint  Patient presents with   Hypertension   Medical Management of Chronic Issues    3 month follow up; pt would like his hernia checked he thinks the surgery he had failed;     Past Medical History-  Patient Active Problem List   Diagnosis Date Noted   Diastolic CHF (HCC) 11/17/2016    Priority: High   Hyperlipidemia 11/27/2017    Priority: Medium    Low testosterone  11/17/2016    Priority: Medium    Elevated blood sugar 11/26/2015    Priority: Medium    Chronic low back pain 08/15/2014    Priority: Medium    Moderate obstructive sleep apnea 09/08/2013    Priority: Medium    SOB (shortness of breath) on exertion 06/16/2013    Priority: Medium    Bright red rectal bleeding 06/16/2013    Priority: Medium    Essential hypertension, benign 03/05/2009    Priority: Medium    former smoker 7.5 pack years quit 1990 11/17/2016    Priority: Low   Morbid obesity (HCC) 11/26/2015    Priority: Low   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    Priority: Low    Medications- reviewed and updated Current Outpatient Medications  Medication Sig Dispense Refill   furosemide  (LASIX ) 20 MG tablet Take 1 tablet (20 mg total) by mouth every morning. 90 tablet 3   ketoconazole  (NIZORAL ) 2 % cream Apply 1 Application topically 2 (two) times daily. Apply for at least a week past rash being gone behind knees 60 g 1   lisinopril  (ZESTRIL ) 10 MG tablet Take 1/2 (one-half) tablet by mouth once daily 45 tablet 3   sildenafil  (REVATIO ) 20 MG tablet Take 2-5 tablets as needed once every 48 hours for erectile dysfunction 50 tablet 3   tirzepatide (ZEPBOUND) 2.5 MG/0.5ML Pen Inject 2.5 mg into the skin once a week. 2 mL 5   No current facility-administered medications for this visit.     Objective:  BP 126/72   Pulse  96   Temp 97.9 F (36.6 C) (Temporal)   Ht 5' 10 (1.778 m)   Wt 290 lb (131.5 kg)   SpO2 93%   BMI 41.61 kg/m  Gen: NAD, resting comfortably CV: RRR no murmurs rubs or gallops Lungs: CTAB no crackles, wheeze, rhonchi Abdomen: soft/nontender/nondistended/normal bowel sounds. No rebound or guarding.  Ext: trace edema Skin: warm, dry     Assessment and Plan   # Groin bulge S:prior hernia on right 09/08/23 . We had noted bulge in this area last year and referred to [facility] again- they evaluated and thought possible hematoma and recommended CT but he was never contacted. He has noted further worsening A/P: with  ongoing groin bulge worsening- get [facility]    #hyperlipidemia with 03/07/22 Coronary calcium score of 2. This was 32nd percentile for age S: Medication: none.  -working on lifestyle changes- but weight largely stable to up 1 pounds. They have bene really busy has made food prep challenging- more grab and go has made it tough.  Lab Results  Component Value Date   CHOL 160 06/03/2024   HDL 28.70 (L) 06/03/2024   LDLCALC 94 06/03/2024   LDLDIRECT 86.0 08/12/2016   TRIG 187.0 (H) 06/03/2024   CHOLHDL 6 06/03/2024  A/P: lipids above goal- has concern  about statins and wants to continue to work on healthy eating and regular exercise.   #Dilation of ascending aorta S: Mild dilation of ascending aorta, measures approximately 40mm at the mid ascending aorta measured in an axial plane on 03/07/22 CT. 12/09/22 listed as 4.2 cm at root and 3.9 proximal arch A/P:offered CT angiogram updtae today- wants to move forward with this- ordered today. Hopefully stable.   -Declines cardiothoracic referral unless worsens  #hypertension #Diastolic CHF- with SOB but cardiology has thought primarily deconditioning. Has also seen pulmonary in the past.  S: medication: lisinopril  10Mg - half of 10 mg, lasix  20 mg daily Home readings #s: sparing checks Shortness of breath: none lately except if  heavy exertion Edema: no increase Weight gain: no stable within a pounds  BP Readings from Last 3 Encounters:  10/24/24 126/72  06/13/24 118/70  06/03/23 120/72  A/P: blood pressure reasonably controlled continue current medications CHF appears euvolemic- continue current medications   # Hyperglycemia/insulin resistance/prediabetes- cbgs elevated but a1c max of 5.6  S:  Medication: none Lab Results  Component Value Date   HGBA1C 5.8 06/03/2024   HGBA1C 5.5 06/03/2023   HGBA1C 5.9 12/09/2022  A/P: a1c mildly high last time- encouraged to work on healthy eating and regular exercise and weight loss   # Chronic bilateral low back pain- ongoing issues- has not found good solution. Still sees chiropractor. More flexibility but still a lot of pain. Can't carry things out in front of him for instance  #OSA- on CPAP regularly   Recommended follow up: Return in about 4 months (around 02/21/2025) for followup or sooner if needed.Schedule b4 you leave.   Lab/Order associations:   ICD-10-CM   1. Immunization due  Z23 Tdap vaccine greater than or equal to 7yo IM    2. Preventative health care  Z00.00     3. Chronic diastolic congestive heart failure (HCC)  I50.32     4. Hyperlipidemia, unspecified hyperlipidemia type  E78.5 Comprehensive metabolic panel with GFR    CBC with Differential/Platelet    5. Essential hypertension, benign  I10     6. Screening for diabetes mellitus  Z13.1     7. Elevated blood sugar  R73.9     8. Screening for prostate cancer  Z12.5     9. Inguinal hernia, right  K40.90 CT PELVIS WO CONTRAST    10. Ascending aorta dilatation  I77.810 CT ANGIO CHEST AORTA W/CM & OR WO/CM    11. Moderate obstructive sleep apnea  G47.33 tirzepatide (ZEPBOUND) 2.5 MG/0.5ML Pen      Meds ordered this encounter  Medications   tirzepatide (ZEPBOUND) 2.5 MG/0.5ML Pen    Sig: Inject 2.5 mg into the skin once a week.    Dispense:  2 mL    Refill:  5    AHI 26 in 2014 and  moderate N/A on OSA    Return precautions advised.  Garnette Lukes, MD

## 2024-10-25 ENCOUNTER — Telehealth: Payer: Self-pay

## 2024-10-25 ENCOUNTER — Ambulatory Visit: Payer: Self-pay | Admitting: Family Medicine

## 2024-10-25 ENCOUNTER — Other Ambulatory Visit (HOSPITAL_COMMUNITY): Payer: Self-pay

## 2024-10-25 LAB — COMPREHENSIVE METABOLIC PANEL WITH GFR
ALT: 22 U/L (ref 0–53)
AST: 15 U/L (ref 0–37)
Albumin: 4.3 g/dL (ref 3.5–5.2)
Alkaline Phosphatase: 54 U/L (ref 39–117)
BUN: 12 mg/dL (ref 6–23)
CO2: 29 meq/L (ref 19–32)
Calcium: 9 mg/dL (ref 8.4–10.5)
Chloride: 100 meq/L (ref 96–112)
Creatinine, Ser: 0.94 mg/dL (ref 0.40–1.50)
GFR: 86.34 mL/min (ref >60.00)
Glucose, Bld: 151 mg/dL — ABNORMAL HIGH (ref 70–99)
Potassium: 3.8 meq/L (ref 3.5–5.1)
Sodium: 140 meq/L (ref 135–145)
Total Bilirubin: 0.5 mg/dL (ref 0.2–1.2)
Total Protein: 6.8 g/dL (ref 6.0–8.3)

## 2024-10-25 NOTE — Telephone Encounter (Signed)
 Pharmacy Patient Advocate Encounter   Received notification from Onbase that prior authorization for Zepbound 2.5MG /0.5ML Auto-injectors is required/requested.   Insurance verification completed.   The patient is insured through [employer].    Per test claim, medication is not covered due to plan/benefit exclusion, PA not submitted at this time

## 2024-10-25 NOTE — Telephone Encounter (Signed)
 Please let patient know that even for sleep apnea it appears Zepbound is not covered-completely excluded-they will not even except prior authorization

## 2024-10-25 NOTE — Telephone Encounter (Signed)
 Please see message and advise

## 2024-10-25 NOTE — Telephone Encounter (Signed)
 Spoke with patient. He is aware of provider remarks and recommendations. No further questions at this time.

## 2024-10-26 ENCOUNTER — Telehealth: Payer: Self-pay

## 2024-10-26 ENCOUNTER — Telehealth: Payer: Self-pay | Admitting: Family Medicine

## 2024-10-26 NOTE — Telephone Encounter (Signed)
 Spoke with patient today. He is aware no to the scheduled CT until after the peer to peer. He stated he will call can cancel until approved.

## 2024-10-26 NOTE — Telephone Encounter (Signed)
 Noted. Working with referral team regarding this.   Copied from CRM #[id number]. Topic: General - Call Back - No Documentation >> Oct 26, 2024  9:30 AM Tonda B wrote: Reason for CRM: [place] imagine  calling in have questions about needs to know if a per to per  please call [phone] [facility]

## 2024-10-28 ENCOUNTER — Other Ambulatory Visit

## 2024-10-31 DIAGNOSIS — M545 Low back pain, unspecified: Secondary | ICD-10-CM | POA: Diagnosis not present

## 2024-10-31 DIAGNOSIS — M9903 Segmental and somatic dysfunction of lumbar region: Secondary | ICD-10-CM | POA: Diagnosis not present

## 2024-10-31 DIAGNOSIS — M9904 Segmental and somatic dysfunction of sacral region: Secondary | ICD-10-CM | POA: Diagnosis not present

## 2024-10-31 DIAGNOSIS — M9905 Segmental and somatic dysfunction of pelvic region: Secondary | ICD-10-CM | POA: Diagnosis not present

## 2024-10-31 DIAGNOSIS — M62830 Muscle spasm of back: Secondary | ICD-10-CM | POA: Diagnosis not present

## 2024-10-31 DIAGNOSIS — M546 Pain in thoracic spine: Secondary | ICD-10-CM | POA: Diagnosis not present

## 2024-10-31 DIAGNOSIS — M542 Cervicalgia: Secondary | ICD-10-CM | POA: Diagnosis not present

## 2024-10-31 DIAGNOSIS — M9901 Segmental and somatic dysfunction of cervical region: Secondary | ICD-10-CM | POA: Diagnosis not present

## 2024-10-31 DIAGNOSIS — M9902 Segmental and somatic dysfunction of thoracic region: Secondary | ICD-10-CM | POA: Diagnosis not present

## 2024-11-07 ENCOUNTER — Encounter: Payer: Self-pay | Admitting: Family Medicine

## 2024-11-15 ENCOUNTER — Encounter: Payer: Self-pay | Admitting: Family Medicine

## 2024-11-24 ENCOUNTER — Inpatient Hospital Stay: Admission: RE | Admit: 2024-11-24 | Discharge: 2024-11-24 | Attending: Family Medicine | Admitting: Family Medicine

## 2024-11-24 ENCOUNTER — Inpatient Hospital Stay: Admission: RE | Admit: 2024-11-24

## 2024-11-24 DIAGNOSIS — K4090 Unilateral inguinal hernia, without obstruction or gangrene, not specified as recurrent: Secondary | ICD-10-CM

## 2024-11-24 DIAGNOSIS — I712 Thoracic aortic aneurysm, without rupture, unspecified: Secondary | ICD-10-CM | POA: Diagnosis not present

## 2024-11-24 DIAGNOSIS — I77810 Thoracic aortic ectasia: Secondary | ICD-10-CM

## 2024-11-24 MED ORDER — IOPAMIDOL (ISOVUE-370) INJECTION 76%
100.0000 mL | Freq: Once | INTRAVENOUS | Status: AC | PRN
  Administered 2024-11-24: 16:00:00 100 mL via INTRAVENOUS

## 2024-11-28 ENCOUNTER — Encounter: Payer: Self-pay | Admitting: Medical Oncology

## 2024-11-29 ENCOUNTER — Encounter: Payer: Self-pay | Admitting: Medical Oncology

## 2024-11-29 ENCOUNTER — Other Ambulatory Visit: Payer: Self-pay | Admitting: Hematology and Oncology

## 2024-11-29 ENCOUNTER — Inpatient Hospital Stay

## 2024-11-29 ENCOUNTER — Inpatient Hospital Stay: Attending: Hematology and Oncology | Admitting: Hematology and Oncology

## 2024-11-29 VITALS — BP 121/74 | HR 106 | Temp 98.2°F | Resp 17 | Ht 70.0 in | Wt 290.0 lb

## 2024-11-29 DIAGNOSIS — Z801 Family history of malignant neoplasm of trachea, bronchus and lung: Secondary | ICD-10-CM | POA: Insufficient documentation

## 2024-11-29 DIAGNOSIS — Z803 Family history of malignant neoplasm of breast: Secondary | ICD-10-CM | POA: Diagnosis not present

## 2024-11-29 DIAGNOSIS — R591 Generalized enlarged lymph nodes: Secondary | ICD-10-CM

## 2024-11-29 DIAGNOSIS — Z808 Family history of malignant neoplasm of other organs or systems: Secondary | ICD-10-CM | POA: Diagnosis not present

## 2024-11-29 DIAGNOSIS — Z820 Family history of epilepsy and other diseases of the nervous system: Secondary | ICD-10-CM | POA: Diagnosis not present

## 2024-11-29 DIAGNOSIS — Z87891 Personal history of nicotine dependence: Secondary | ICD-10-CM | POA: Insufficient documentation

## 2024-11-29 LAB — CMP (CANCER CENTER ONLY)
ALT: 26 U/L (ref 0–44)
AST: 24 U/L (ref 15–41)
Albumin: 4.6 g/dL (ref 3.5–5.0)
Alkaline Phosphatase: 65 U/L (ref 38–126)
Anion gap: 9 (ref 5–15)
BUN: 15 mg/dL (ref 8–23)
CO2: 27 mmol/L (ref 22–32)
Calcium: 9.8 mg/dL (ref 8.9–10.3)
Chloride: 105 mmol/L (ref 98–111)
Creatinine: 1.07 mg/dL (ref 0.61–1.24)
GFR, Estimated: >60 mL/min
Glucose, Bld: 128 mg/dL — ABNORMAL HIGH (ref 70–99)
Potassium: 5.2 mmol/L — ABNORMAL HIGH (ref 3.5–5.1)
Sodium: 140 mmol/L (ref 135–145)
Total Bilirubin: 0.3 mg/dL (ref 0.0–1.2)
Total Protein: 7.5 g/dL (ref 6.5–8.1)

## 2024-11-29 LAB — CBC WITH DIFFERENTIAL (CANCER CENTER ONLY)
Abs Immature Granulocytes: 0.04 K/uL (ref 0.00–0.07)
Basophils Absolute: 0 K/uL (ref 0.0–0.1)
Basophils Relative: 0 %
Eosinophils Absolute: 0.1 K/uL (ref 0.0–0.5)
Eosinophils Relative: 1 %
HCT: 42.7 % (ref 39.0–52.0)
Hemoglobin: 14.4 g/dL (ref 13.0–17.0)
Immature Granulocytes: 0 %
Lymphocytes Relative: 16 %
Lymphs Abs: 1.5 K/uL (ref 0.7–4.0)
MCH: 28.2 pg (ref 26.0–34.0)
MCHC: 33.7 g/dL (ref 30.0–36.0)
MCV: 83.6 fL (ref 80.0–100.0)
Monocytes Absolute: 0.5 K/uL (ref 0.1–1.0)
Monocytes Relative: 6 %
Neutro Abs: 7 K/uL (ref 1.7–7.7)
Neutrophils Relative %: 77 %
Platelet Count: 160 K/uL (ref 150–400)
RBC: 5.11 MIL/uL (ref 4.22–5.81)
RDW: 13.6 % (ref 11.5–15.5)
WBC Count: 9.2 K/uL (ref 4.0–10.5)
nRBC: 0 % (ref 0.0–0.2)

## 2024-11-29 LAB — C-REACTIVE PROTEIN: CRP: <0.5 mg/dL

## 2024-11-29 LAB — SEDIMENTATION RATE: Sed Rate: 21 mm/h — ABNORMAL HIGH (ref 0–16)

## 2024-11-29 LAB — LACTATE DEHYDROGENASE: LDH: 161 U/L (ref 105–235)

## 2024-11-29 LAB — PSA: Prostatic Specific Antigen: 0.7 ng/mL (ref 0.00–4.00)

## 2024-11-29 NOTE — Progress Notes (Signed)
 [facility]  Patient presented to clinic, with spouse, for his scheduled appointment with Dr. Federico. I introduced myself and provided them with my direct contact information. Patient and spouse were both encouraged to call me with any questions/concerns they may have.  Colene KYM Raider, RN, BSN Oncology Nurse Navigator, [facility] 11/29/2024 1:10 PM

## 2024-11-29 NOTE — Progress Notes (Signed)
 " [facility] Telephone:([phone]   [phone]  INITIAL CONSULT NOTE  Patient Care Team: Katrinka Garnette KIDD, MD as PCP - General (Family Medicine) Golden Forestine BROCKS, RN as Oncology Nurse Navigator (Medical Oncology)  Hematological/Oncological History # Diffuse Lymphadenopathy 11/24/2024: CT pelvis without contrast showed multiple enlarged retroperitoneal, right pelvic, and right inguinal lymph nodes concerning for metastatic disease versus lymphoproliferative disorder.  There are mildly enlarged left pelvic and inguinal lymph nodes as well.  Lymph nodes measure up to 5 x 7 cm. 11/29/2024: establish care with Dr. Federico   CHIEF COMPLAINTS/PURPOSE OF CONSULTATION:  Diffuse Lymphadenopathy   HISTORY OF PRESENTING ILLNESS:  Kenneth Berry 63 y.o. male with medical history significant for arthritis, hemorrhoids, hypertension, and sleep apnea who presents for evaluation of diffuse lymphadenopathy.  On review of the previous records Kenneth Berry underwent a CT pelvis on 11/24/2024.  Results showed multiple enlarged retroperitoneal, right pelvic, and right inguinal lymph nodes concerning for metastatic disease versus lymphoproliferative disorder.  There are mildly enlarged left pelvic and inguinal lymph nodes as well.  Lymph nodes measure up to 5 x 7 cm.  Due to concern for these findings the patient was referred to hematology for further evaluation and management.  On exam today Kenneth Berry reports that he can feel the lymph nodes in his groin as they are quite prominent.  They are firm but not painful.  He reports that he originally thought this area was a hernia but over the last several months the area has been getting larger.  He reports they have been present for at least 1 year.  He notes he is not having any infectious symptoms such as fevers, chills, sweats, nausea, or diarrhea.  He denies any dysuria or urinary symptoms.  He reports he is up-to-date on his PSA.   He has no known history of inflammatory disorders such as rheumatoid arthritis, lupus, or psoriasis.  He reports that otherwise he has recently been his baseline level of health with no changes in his weight.  On further discussion he reports his mother is alive at the age of 74 and his father has Parkinson's disease.  He had a brother who had a rare chest wall cancer and was treated.  He reports he has a sister who had breast cancer and he has 2 adopted children with no biological children.  He notes that he is a former smoker having quit in his 58s.  He now currently vapes.  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  A full 10 point ROS was otherwise negative.  MEDICAL HISTORY:  Past Medical History:  Diagnosis Date   Allergy    seasonal spring the worst    Arthritis    spine,neck   Hemorrhoid    occasionally bleeds    Hypertension    Sleep apnea    wears cpap    SURGICAL HISTORY: Past Surgical History:  Procedure Laterality Date   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   Neck fusion     right inguinal hernia repair Right    october 2024   TONSILLECTOMY AND ADENOIDECTOMY     UVULOPALATOPHARYNGOPLASTY      SOCIAL HISTORY: Social History   Socioeconomic History   Marital status: Married    Spouse name: Not on file   Number of children: 2   Years of education: Not on file   Highest education level: Not on file  Occupational  History   Occupation: Visual Merchandiser  Tobacco Use   Smoking status: Former    Current packs/day: 0.00    Average packs/day: 0.8 packs/day for 10.0 years (7.5 ttl pk-yrs)    Types: Cigarettes    Start date: 12/08/1978    Quit date: 12/08/1988    Years since quitting: 36.0   Smokeless tobacco: Former  Substance and Sexual Activity   Alcohol use: No    Alcohol/week: 0.0 standard drinks of alcohol   Drug use: No   Sexual activity: Not on file  Other Topics Concern   Not on file  Social History Narrative   Married. 2  adopted children ( adopted son at 3.5 months, daughter- sibling to the son). 18 son interested in music and 15 in 2019.       [employer]- produce      Hobbies: video games  (CPU- plays skyrim as of 2017)      Thinking about biking- no regular exercise   Social Drivers of Health   Tobacco Use: Medium Risk (10/24/2024)   Patient History    Smoking Tobacco Use: Former    Smokeless Tobacco Use: Former    Passive Exposure: Not on Stage Manager: Not on Ship Broker Insecurity: Not on file  Transportation Needs: Not on file  Physical Activity: Not on file  Stress: Not on file  Social Connections: Not on file  Intimate Partner Violence: Not on file  Depression (PHQ2-9): Low Risk (10/24/2024)   Depression (PHQ2-9)    PHQ-2 Score: 3  Alcohol Screen: Not on file  Housing: Not on file  Utilities: Not on file  Health Literacy: Not on file    FAMILY HISTORY: Family History  Problem Relation Age of Onset   Arrhythmia Father        atrial flutter   Heart failure Father        42   Parkinson's disease Father        since 47s   Other Mother        47 years old healthy   Cancer Maternal Grandfather        lung   Bipolar disorder Sister        full   Healthy Brother        full   Healthy Brother        half brother- neck and back issues   Healthy Brother        half brother   Colon cancer Neg Hx    Colon polyps Neg Hx    Esophageal cancer Neg Hx    Rectal cancer Neg Hx    Stomach cancer Neg Hx     ALLERGIES:  has no known allergies.  MEDICATIONS:  Current Outpatient Medications  Medication Sig Dispense Refill   furosemide  (LASIX ) 20 MG tablet Take 1 tablet (20 mg total) by mouth every morning. 90 tablet 3   ketoconazole  (NIZORAL ) 2 % cream Apply 1 Application topically 2 (two) times daily. Apply for at least a week past rash being gone behind knees 60 g 1   lisinopril  (ZESTRIL ) 10 MG tablet Take 1/2 (one-half) tablet by mouth once daily 45 tablet 3    sildenafil  (REVATIO ) 20 MG tablet Take 2-5 tablets as needed once every 48 hours for erectile dysfunction 50 tablet 3   tirzepatide  (ZEPBOUND ) 2.5 MG/0.5ML Pen Inject 2.5 mg into the skin once a week. 2 mL 5   No current facility-administered medications for this visit.    REVIEW  OF SYSTEMS:   Constitutional: ( - ) fevers, ( - )  chills , ( - ) night sweats Eyes: ( - ) blurriness of vision, ( - ) double vision, ( - ) watery eyes Ears, nose, mouth, throat, and face: ( - ) mucositis, ( - ) sore throat Respiratory: ( - ) cough, ( - ) dyspnea, ( - ) wheezes Cardiovascular: ( - ) palpitation, ( - ) chest discomfort, ( - ) lower extremity swelling Gastrointestinal:  ( - ) nausea, ( - ) heartburn, ( - ) change in bowel habits Skin: ( - ) abnormal skin rashes Lymphatics: ( - ) new lymphadenopathy, ( - ) easy bruising Neurological: ( - ) numbness, ( - ) tingling, ( - ) new weaknesses Behavioral/Psych: ( - ) mood change, ( - ) new changes  All other systems were reviewed with the patient and are negative.  PHYSICAL EXAMINATION:  There were no vitals filed for this visit. There were no vitals filed for this visit.  GENERAL: well appearing middle-age Caucasian male in NAD  SKIN: skin color, texture, turgor are normal, no rashes or significant lesions LYMPH: Bulky right sided inguinal lymphadenopathy.  Firm and nontender. EYES: conjunctiva are pink and non-injected, sclera clear LUNGS: clear to auscultation and percussion with normal breathing effort HEART: regular rate & rhythm and no murmurs and no lower extremity edema Musculoskeletal: no cyanosis of digits and no clubbing  PSYCH: alert & oriented x 3, fluent speech NEURO: no focal motor/sensory deficits  LABORATORY DATA:  I have reviewed the data as listed    Latest Ref Rng & Units 10/24/2024    2:49 PM 06/03/2024    8:47 AM 06/03/2023    9:23 AM  CBC  WBC 4.0 - 10.5 K/uL 8.7  7.5  5.7   Hemoglobin 13.0 - 17.0 g/dL 84.9  85.2  85.8    Hematocrit 39.0 - 52.0 % 44.2  43.5  43.0   Platelets 150.0 - 400.0 K/uL 181.0  164.0  166.0        Latest Ref Rng & Units 10/24/2024    2:49 PM 06/03/2024    8:47 AM 06/03/2023    9:23 AM  CMP  Glucose 70 - 99 mg/dL 848  886  896   BUN 6 - 23 mg/dL 12  15  17    Creatinine 0.40 - 1.50 mg/dL 9.05  9.06  9.14   Sodium 135 - 145 mEq/L 140  140  136   Potassium 3.5 - 5.1 mEq/L 3.8  4.5  3.6   Chloride 96 - 112 mEq/L 100  106  103   CO2 19 - 32 mEq/L 29  27  26    Calcium 8.4 - 10.5 mg/dL 9.0  9.0  9.1   Total Protein 6.0 - 8.3 g/dL 6.8  7.0  6.6   Total Bilirubin 0.2 - 1.2 mg/dL 0.5  0.5  0.6   Alkaline Phos 39 - 117 U/L 54  51  52   AST 0 - 37 U/L 15  17  17    ALT 0 - 53 U/L 22  23  20       ASSESSMENT & PLAN Garnette CHRISTELLA Saba 63 y.o. male with medical history significant for arthritis, hemorrhoids, hypertension, and sleep apnea who presents for evaluation of diffuse lymphadenopathy.  After review of the labs, review of the records, and discussion with the patient the patients findings are most consistent with diffuse lymphadenopathy, most consistent with CLL versus lymphoma.  #Diffuse Lymphadenopathy --  Will perform PET CT scan to evaluate the extent of disease. -- Requested surgical evaluation for biopsy of lymph nodes.  Due to massive size surgical team recommends IR guided core biopsy. -- Will order baseline labs to include CBC, CMP, LDH as well as flow cytometry.  Will also order inflammatory markers with ESR and CRP. -- Plan to have patient return to clinic once biopsy and PET CT scan are obtained.  No orders of the defined types were placed in this encounter.   All questions were answered. The patient knows to call the clinic with any problems, questions or concerns.  A total of more than 60 minutes were spent on this encounter with face-to-face time and non-face-to-face time, including preparing to see the patient, ordering tests and/or medications, counseling the patient  and coordination of care as outlined above.   Norleen IVAR Kidney, MD Department of Hematology/Oncology [facility] Phone: [phone] Pager: [phone] Email: [email or web address]  11/29/2024 8:02 AM  "

## 2024-11-30 LAB — SURGICAL PATHOLOGY

## 2024-12-02 NOTE — Progress Notes (Unsigned)
 Johann Sieving, MD  Baldwin Channing CROME Yes it should be core biopsy Local only ok  DD[facility]       Previous Messages    ----- Message ----- From: Baldwin Channing CROME Sent: 12/02/2024   2:11 PM EST To: Sieving Johann, MD Subject: CT Biopsy                                      Dr. Johann,  The office has ask for this pt bx to be done as a US  CORE BX. Would you still like it with a Local anesthetic?     Johann Sieving, MD  Baldwin Channing L PROCEDURE / BIOPSY REVIEW Date: 11/30/2024  Requested Biopsy site: R groin mass Reason for request: r/o lymphoma v mets Imaging review: Best seen on CT 11/24/24  Decision: Approved Imaging modality to perform: Ultrasound Schedule with: No sedation / Local anesthetic Schedule for: Any VIR  Additional comments: @VIR : send in saline r/o lymphoma  Please contact me with questions, concerns, or if issue pertaining to this request arise.  Dayne Sieving Johann, MD [facility] Inter[facility]    Previous Messages    ----- Message ----- From: Baldwin Channing CROME Sent: 11/30/2024   1:43 PM EST To: Channing CROME Baldwin; Ir Procedure Requests  Procedure :CT Biopsy  Reason :biopsy of enlarged lymph node Dx: Lymphadenopathy [R59.1 (ICD-10-CM)]    History :CT ANGIO CHEST AORTA W/CM & OR WO/CM,CT PELVIS WO CONTRAST. Pt is schedule for a PET on Jan 2,2026  Provider:Neomi Johnston DASEN, PA-C  Provider contact ; [phone] ----- Message ----- From: Johann Sieving, MD Sent: 11/30/2024   3:28 PM EST To: Channing CROME Baldwin  PROCEDURE / BIOPSY REVIEW Date: 11/30/2024  Requested Biopsy site: R groin mass Reason for request: r/o lymphoma v mets Imaging review: Best seen on CT 11/24/24  Decision: Approved Imaging modality to perform: Ultrasound Schedule with: No sedation / Local anesthetic Schedule for: Any VIR  Additional comments: @VIR : send in saline r/o lymphoma  Please contact me with questions,  concerns, or if issue pertaining to this request arise.  Dayne Sieving Johann, MD V[facility] Inter[facility] Radiology Specialists [facility] Radiology ----- Message ----- From: Baldwin Channing CROME Sent: 11/30/2024   1:43 PM EST To: Channing CROME Baldwin; Ir Procedure Requests  Procedure :CT Biopsy  Reason :biopsy of enlarged lymph node Dx: Lymphadenopathy [R59.1 (ICD-10-CM)]    History :CT ANGIO CHEST AORTA W/CM & OR WO/CM,CT PELVIS WO CONTRAST. Pt is schedule for a PET on Jan 2,2026  Provider:Neomi Johnston DASEN, PA-C  Provider contact ; [phone]

## 2024-12-02 NOTE — Progress Notes (Unsigned)
 Kenneth Sieving, MD  [name] PROCEDURE / BIOPSY REVIEW Date: 11/30/2024  Requested Biopsy site: R groin mass Reason for request: r/o lymphoma v mets Imaging review: Best seen on CT 11/24/24  Decision: Approved Imaging modality to perform: Ultrasound Schedule with: No sedation / Local anesthetic Schedule for: Any VIR  Additional comments: @VIR : send in saline r/o lymphoma  Please contact me with questions, concerns, or if issue pertaining to this request arise.  Kenneth Berry Johann, MD [facility]       Previous Messages    ----- Message ----- From: [name] Sent: 11/30/2024   1:43 PM EST To: Rosella CROME Baldwin; Ir Procedure Requests  Procedure :CT Biopsy  Reason :biopsy of enlarged lymph node Dx: Lymphadenopathy [R59.1 (ICD-10-CM)]    History :CT ANGIO CHEST AORTA W/CM & OR WO/CM,CT PELVIS WO CONTRAST. Pt is schedule for a PET on Jan 2,2026  Provider:Neomi Johnston DASEN, PA-C  Provider contact ; [phone]

## 2024-12-05 ENCOUNTER — Encounter: Payer: Self-pay | Admitting: Medical Oncology

## 2024-12-05 LAB — FLOW CYTOMETRY

## 2024-12-05 NOTE — Progress Notes (Signed)
 [facility]  Return call to patient regarding questions about his biopsy, confirmed with patient IR biopsy will be under local anesthesia, with a needle under US /CT or MRI guidance to collect tissue. Patient also inquired about his labs, informed him labs look good, his sedimentation rate is slightly elevated, and that the biopsy should provide us  with a definitive answer. All patient's questions answered to his satisfactions. Patient denies further questions at this time.   Colene KYM Raider, RN, BSN Oncology Nurse Navigator, [facility] 12/05/2024 10:24 AM

## 2024-12-09 ENCOUNTER — Encounter (HOSPITAL_COMMUNITY)
Admission: RE | Admit: 2024-12-09 | Discharge: 2024-12-09 | Disposition: A | Discharge status: Home / Self Care | Source: Ambulatory Visit | Attending: Hematology and Oncology | Admitting: Hematology and Oncology

## 2024-12-09 DIAGNOSIS — R591 Generalized enlarged lymph nodes: Secondary | ICD-10-CM | POA: Insufficient documentation

## 2024-12-09 LAB — GLUCOSE, CAPILLARY: Glucose-Capillary: 107 mg/dL — ABNORMAL HIGH (ref 70–99)

## 2024-12-09 MED ORDER — FLUDEOXYGLUCOSE F - 18 (FDG) INJECTION
15.0000 | Freq: Once | INTRAVENOUS | Status: AC
  Administered 2024-12-09: 15 via INTRAVENOUS

## 2024-12-26 ENCOUNTER — Ambulatory Visit (HOSPITAL_COMMUNITY)
Admission: RE | Admit: 2024-12-26 | Discharge: 2024-12-26 | Disposition: A | Payer: Self-pay | Source: Ambulatory Visit | Attending: Physician Assistant

## 2024-12-26 DIAGNOSIS — C8310 Mantle cell lymphoma, unspecified site: Secondary | ICD-10-CM | POA: Diagnosis present

## 2024-12-26 DIAGNOSIS — R591 Generalized enlarged lymph nodes: Secondary | ICD-10-CM

## 2024-12-26 MED ORDER — LIDOCAINE HCL 1 % IJ SOLN
INTRAMUSCULAR | Status: AC
  Filled 2024-12-26: qty 20

## 2024-12-26 NOTE — Procedures (Signed)
 Interventional Radiology Procedure:   Indications: Right inguinal and pelvic lymphadenopathy   Procedure: US  guided core biopsy of right inguinal lymph node  Findings: Multiple core biopsies from an enlarged right inguinal lymph node placed on Telfa pad with saline   Complications: None     EBL: Minimal  Plan: Discharge to home.   Naithan Delage R. Philip, MD  Pager: [phone]

## 2024-12-28 LAB — SURGICAL PATHOLOGY

## 2025-01-03 ENCOUNTER — Encounter: Payer: Self-pay | Admitting: Medical Oncology

## 2025-01-03 NOTE — Progress Notes (Signed)
 [facility]  Return call to patient regarding his question of molecular testing results. Informed patient that per pathology, testing results expected on 01/07/2025. Patient gave verbal understanding, denies further questions.   Colene KYM Raider, RN, BSN Oncology Nurse Navigator, [facility] 01/03/2025 3:06 PM

## 2025-01-06 ENCOUNTER — Encounter: Payer: Self-pay | Admitting: Medical Oncology

## 2025-01-06 ENCOUNTER — Encounter (HOSPITAL_COMMUNITY): Payer: Self-pay | Admitting: Physician Assistant

## 2025-01-06 NOTE — Progress Notes (Signed)
 [facility]  Spoke with patient and informed him, per DEVONNA Lis, biopsy molecular testing resulted with TP53  mutation not detected. Patient informed Dr. Federico would like to see patient for discussion of next steps. Patient scheduled for Tuesday at 1 PM. Patient gave verbal understanding, confirms appointment and denies further questions at this time.   Colene KYM Raider, RN, BSN Oncology Nurse Navigator, [facility] 01/06/2025 3:33 PM

## 2025-01-10 ENCOUNTER — Inpatient Hospital Stay: Admitting: Hematology and Oncology

## 2025-01-10 ENCOUNTER — Inpatient Hospital Stay: Attending: Hematology and Oncology | Admitting: Hematology and Oncology

## 2025-01-10 ENCOUNTER — Inpatient Hospital Stay: Payer: Self-pay | Attending: Hematology and Oncology | Admitting: Physician Assistant

## 2025-01-10 VITALS — BP 129/79 | HR 99 | Temp 98.0°F | Resp 18 | Ht 70.0 in | Wt 293.4 lb

## 2025-01-10 DIAGNOSIS — C8313 Mantle cell lymphoma, intra-abdominal lymph nodes: Secondary | ICD-10-CM

## 2025-01-11 ENCOUNTER — Telehealth: Payer: Self-pay | Admitting: *Deleted

## 2025-01-11 NOTE — Telephone Encounter (Signed)
 Referral fax'd to [facility] for Dr. 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 Federico request. Pt is aware. Fax successful

## 2025-02-21 ENCOUNTER — Ambulatory Visit: Admitting: Family Medicine

## 2025-06-15 ENCOUNTER — Encounter: Admitting: Family Medicine
